# Patient Record
Sex: Male | Born: 1965
Health system: Southern US, Community
[De-identification: ages and names within clinical notes are randomized; demographics above are authoritative.]

## PROBLEM LIST (undated history)

## (undated) DIAGNOSIS — E785 Hyperlipidemia, unspecified: Secondary | ICD-10-CM

## (undated) DIAGNOSIS — I10 Essential (primary) hypertension: Secondary | ICD-10-CM

## (undated) HISTORY — DX: Hyperlipidemia, unspecified: E78.5

## (undated) NOTE — *Deleted (*Deleted)
Advanced Heart Failure Clinic Note   PCP: Patient, No Pcp Per PCP-Cardiologist: Nanetta Batty, MD  HF: Dr Gala Romney  HPI: Martin Keller is a 26 y.o. malewith a hx of HTN, tobacco use, systolic HF (diagnosed 03/2018), and severe MR (diagnosed 12/19). He does not have insurance.  He was admitted 12/1-12/5/19 with acute systolic HF. Echo showed EF 30-35% and severe MR. R/LHC showed normal coronaries, severe NICM with EF 20-25%, and severe 3-4+ MR. TEE completed to further evaluate MR and showed severe functional MR.   Today he returns for HF follow up. Last visit he was instructed to take entresto and coreg twice a day because he had previously been taking entresto and coreg once a day. Overall feeling fine. Denies SOB/PND/Orthopnea. Appetite ok. No fever or chills. Weight at home  pounds. Taking all medications. Echo 11/2018: EF ~30%  Echo 03/04/18:EF 30-35%, diffuse HK, severe MR, LA severely dilated, RA mod dilated, RV mildly down  [facility] 03/05/18 Ao = 108/68 (84)  LV = 110/15 RA = 2 RV = 39/4 PA = 31/10 (20) PCW = 8 (no significant v-waves) Fick cardiac output/index = 5.0/2.7 SVR = 1307 PVR = 2.4 WU Ao sat = 97% PA sat = 68%, 70% Assessment: 1. Normal coronaries 2. Severe NICM EF 20-25% 3. 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 Well compensated filling pressures  TEE 03/07/18:  EF 25%, diffuse HK, RV mildly decreased function, trivial TR, severe central mitral regurgitation, ERO 0.6 cm^2 by PISA.  Impression: severe functional mitral regurgitation  SH: Smokes 2 black and milds/day. no insurance, no problems with transportation, unable to afford medications. Rare ETOH use. No longer working.  FH: mother with murmur, died of MI at 43 years, father died of MI in 46s.  Review of systems complete and found to be negative unless listed in HPI.   Past Medical History:  Diagnosis Date  . Hyperlipidemia   . Hypertension     Current Outpatient Medications  Medication Sig Dispense Refill  .  aspirin EC 81 MG tablet Take 1 tablet (81 mg total) by mouth daily. Swallow whole. 90 tablet 3  . carvedilol (COREG) 3.125 MG tablet Take 1 tablet (3.125 mg total) by mouth 2 (two) times daily with a meal. 180 tablet 2  . furosemide (LASIX) 40 MG tablet Take 1 tablet (40 mg total) by mouth daily. 90 tablet 2  . Multiple Vitamin (MULTIVITAMIN WITH MINERALS) TABS tablet Take 1 tablet by mouth daily.     . rosuvastatin (CRESTOR) 10 MG tablet Take 1 tablet (10 mg total) by mouth at bedtime. 90 tablet 2  . sacubitril-valsartan (ENTRESTO) 49-51 MG Take 1 tablet by mouth 2 (two) times daily. 180 tablet 2  . spironolactone (ALDACTONE) 25 MG tablet Take 1 tablet (25 mg total) by mouth daily. 90 tablet 2   No current facility-administered medications for this visit.    No Known Allergies    Social History   Socioeconomic History  . Marital status: Divorced    Spouse name: Not on file  . Number of children: Not on file  . Years of education: Not on file  . Highest education level: Not on file  Occupational History  . Not on file  Tobacco Use  . Smoking status: Current Every Day Smoker    Types: Cigars  . Smokeless tobacco: Never Used  Vaping Use  . Vaping Use: Never used  Substance and Sexual Activity  . Alcohol use: Yes  . Drug use: Yes    Types: Marijuana  .  Sexual activity: Yes    Partners: Female    Birth control/protection: None  Other Topics Concern  . Not on file  Social History Narrative  . Not on file   Social Determinants of Health   Financial Resource Strain:   . Difficulty of Paying Living Expenses: Not on file  Food Insecurity:   . Worried About Programme researcher, broadcasting/film/video in the Last Year: Not on file  . Ran Out of Food in the Last Year: Not on file  Transportation Needs:   . Lack of Transportation (Medical): Not on file  . Lack of Transportation (Non-Medical): Not on file  Physical Activity:   . Days of Exercise per Week: Not on file  . Minutes of Exercise per  Session: Not on file  Stress:   . Feeling of Stress : Not on file  Social Connections:   . Frequency of Communication with Friends and Family: Not on file  . Frequency of Social Gatherings with Friends and Family: Not on file  . Attends Religious Services: Not on file  . Active Member of Clubs or Organizations: Not on file  . Attends Banker Meetings: Not on file  . Marital Status: Not on file  Intimate Partner Violence:   . Fear of Current or Ex-Partner: Not on file  . Emotionally Abused: Not on file  . Physically Abused: Not on file  . Sexually Abused: Not on file      Family History  Problem Relation Age of Onset  . Heart attack Mother        Died at age of 52 from heart attack.  Marland Kitchen Heart attack Father        Died at age of 18 from heart attack.  Marland Kitchen Heart disease Brother        Heart surgery at age of 38.  . Diabetes Child     There were no vitals filed for this visit. Wt Readings from Last 3 Encounters:  01/22/20 72.8 kg (160 lb 6.4 oz)  12/02/19 71.9 kg (158 lb 9.6 oz)  11/28/19 72.1 kg (159 lb)    PHYSICAL EXAM: General:  Well appearing. No resp difficulty HEENT: normal Neck: supple. no JVD. Carotids 2+ bilat; no bruits. No lymphadenopathy or thryomegaly appreciated. Cor: PMI nondisplaced. Regular rate & rhythm. No rubs, gallops or murmurs. Lungs: clear Abdomen: soft, nontender, nondistended. No hepatosplenomegaly. No bruits or masses. Good bowel sounds. Extremities: no cyanosis, clubbing, rash, edema Neuro: alert & orientedx3, cranial nerves grossly intact. moves all 4 extremities w/o difficulty. Affect pleasant  ASSESSMENT & PLAN: 1. Chronic systolic HF due to NICM (new as of 03/2018). LHC normal coronaries 12/3. Etiology unclear - possibly HTN or ETOH.  - Echo 12/19: EF 30-35%, diffuse HK, severe MR, LA severely dilated, RA mod dilated, RV mildly down.  - TEE 12/19: EF 25%, severe functional MR - Echo 8/20 EF 25-30% moderate MR -NYHA  Volume  status stable.  -Continue coreg 3.125 mg twice a day.  - Continue entresto 49-51 mg twice a day - Continue spiro to 25 mg daily  - Plan to repeat ECHO in 3 months.    2. Severe MR - TEE 03/07/18: EF 25%, diffuse HK, RV mildly decreased function, trivial TR, severe central mitral regurgitation, ERO 0.6 cm^2 by PISA.  Impression: severe functional mitral regurgitation.  - Moderate by echo in 8/20.  - Functional MR  3. HTN Stable.   4. Tobacco use - Smoking 2 black and milds/day. - Discussed  smoking cessation again today   5. ETOH use -Rarely drinking.    Follow up    Tonye Becket, NP 02/04/20

---

## 2017-08-26 ENCOUNTER — Encounter (HOSPITAL_BASED_OUTPATIENT_CLINIC_OR_DEPARTMENT_OTHER): Payer: Self-pay | Admitting: Emergency Medicine

## 2017-08-26 ENCOUNTER — Other Ambulatory Visit: Payer: Self-pay

## 2017-08-26 DIAGNOSIS — J039 Acute tonsillitis, unspecified: Secondary | ICD-10-CM | POA: Insufficient documentation

## 2017-08-26 DIAGNOSIS — F17200 Nicotine dependence, unspecified, uncomplicated: Secondary | ICD-10-CM | POA: Insufficient documentation

## 2017-08-26 LAB — RAPID STREP SCREEN (MED CTR MEBANE ONLY): STREPTOCOCCUS, GROUP A SCREEN (DIRECT): NEGATIVE

## 2017-08-26 NOTE — ED Triage Notes (Signed)
PT presents with c/o sore throat for 4 days

## 2017-08-27 ENCOUNTER — Emergency Department (HOSPITAL_BASED_OUTPATIENT_CLINIC_OR_DEPARTMENT_OTHER): Payer: Self-pay

## 2017-08-27 ENCOUNTER — Emergency Department (HOSPITAL_BASED_OUTPATIENT_CLINIC_OR_DEPARTMENT_OTHER)
Admission: EM | Admit: 2017-08-27 | Discharge: 2017-08-27 | Disposition: A | Discharge status: Home / Self Care | Payer: Self-pay | Attending: Emergency Medicine | Admitting: Emergency Medicine

## 2017-08-27 DIAGNOSIS — J039 Acute tonsillitis, unspecified: Secondary | ICD-10-CM

## 2017-08-27 LAB — BASIC METABOLIC PANEL
ANION GAP: 7 (ref 5–15)
BUN: 13 mg/dL (ref 6–20)
CO2: 21 mmol/L — ABNORMAL LOW (ref 22–32)
Calcium: 8.3 mg/dL — ABNORMAL LOW (ref 8.9–10.3)
Chloride: 110 mmol/L (ref 101–111)
Creatinine, Ser: 0.92 mg/dL (ref 0.61–1.24)
Glucose, Bld: 103 mg/dL — ABNORMAL HIGH (ref 65–99)
Potassium: 4.1 mmol/L (ref 3.5–5.1)
SODIUM: 138 mmol/L (ref 135–145)

## 2017-08-27 MED ORDER — HYDROCODONE-ACETAMINOPHEN 5-325 MG PO TABS
1.0000 | ORAL_TABLET | Freq: Once | ORAL | Status: AC
  Administered 2017-08-27: 1 via ORAL
  Filled 2017-08-27: qty 1

## 2017-08-27 MED ORDER — CLINDAMYCIN HCL 150 MG PO CAPS: 300.0000 mg | ORAL_CAPSULE | Freq: Four times a day (QID) | ORAL | 0 refills | Status: DC

## 2017-08-27 MED ORDER — HYDROCODONE-ACETAMINOPHEN 5-325 MG PO TABS: 1.0000 | ORAL_TABLET | Freq: Four times a day (QID) | ORAL | 0 refills | Status: DC | PRN

## 2017-08-27 MED ORDER — DEXAMETHASONE SODIUM PHOSPHATE 10 MG/ML IJ SOLN
10.0000 mg | Freq: Once | INTRAMUSCULAR | Status: AC
  Administered 2017-08-27: 10 mg via INTRAMUSCULAR
  Filled 2017-08-27: qty 1

## 2017-08-27 MED ORDER — IOPAMIDOL (ISOVUE-300) INJECTION 61%
100.0000 mL | Freq: Once | INTRAVENOUS | Status: AC | PRN
  Administered 2017-08-27: 100 mL via INTRAVENOUS

## 2017-08-27 MED ORDER — CLINDAMYCIN HCL 150 MG PO CAPS
300.0000 mg | ORAL_CAPSULE | Freq: Once | ORAL | Status: AC
  Administered 2017-08-27: 300 mg via ORAL
  Filled 2017-08-27: qty 2

## 2017-08-27 NOTE — ED Provider Notes (Signed)
[facility] EMERGENCY DEPARTMENT Provider Note   CSN: [id number] Arrival date & time: 08/26/17  2225     History   Chief Complaint Chief Complaint  Patient presents with  . Sore Throat    HPI Martin Keller is a 52 y.o. male.  HPI   This is a 52 year old male who presents with 4-day history of sore throat.  Patient reports difficulty swallowing and pain over the anterior neck right greater than left.  Patient reports difficulty swallowing.  He has not taken anything for his pain.  Currently he rates his pain at 8 out of 10.  He denies any cough, ear pain, congestion.  Reports chills but no noted fevers at home.  Temperature 100.5 upon arrival.  History reviewed. No pertinent past medical history.  There are no active problems to display for this patient.   History reviewed. No pertinent surgical history.      Home Medications    Prior to Admission medications   Medication Sig Start Date End Date Taking? Authorizing Provider  clindamycin (CLEOCIN) 150 MG capsule Take 2 capsules (300 mg total) by mouth 4 (four) times daily. 08/27/17   Adaleah Forget, Mayer Masker, MD  HYDROcodone-acetaminophen (NORCO/VICODIN) 5-325 MG tablet Take 1 tablet by mouth every 6 (six) hours as needed. 08/27/17   Karyn Brull, Mayer Masker, MD    Family History No family history on file.  Social History Social History   Tobacco Use  . Smoking status: Current Every Day Smoker  . Smokeless tobacco: Never Used  Substance Use Topics  . Alcohol use: Never    Frequency: Never  . Drug use: Never     Allergies   Patient has no known allergies.   Review of Systems Review of Systems  Constitutional: Positive for fever.  HENT: Positive for sore throat and trouble swallowing. Negative for congestion.   Respiratory: Negative for shortness of breath.   Cardiovascular: Negative for chest pain.  Gastrointestinal: Negative for abdominal pain, nausea and vomiting.  Musculoskeletal: Negative for neck  pain and neck stiffness.  All other systems reviewed and are negative.    Physical Exam Updated Vital Signs BP (!) 150/91 (BP Location: Right Arm)   Pulse 77   Temp (!) 100.5 F (38.1 C) (Oral)   Resp 20   Ht 5\' 10"  (1.778 m)   Wt 78 kg (172 lb)   SpO2 95%   BMI 24.68 kg/m   Physical Exam  Constitutional: He is oriented to person, place, and time. He appears well-developed and well-nourished.  Non-toxic appearance. He does not appear ill.  HENT:  Head: Normocephalic and atraumatic.  Slightly muffled voice, swelling of the bilateral tonsils right greater than left without obvious abscess, uvula deviates to the right, no exudate  Eyes: Pupils are equal, round, and reactive to light.  Neck: Normal range of motion. Neck supple.  Cardiovascular: Normal rate, regular rhythm and normal heart sounds.  No murmur heard. Pulmonary/Chest: Effort normal and breath sounds normal. No respiratory distress. He has no wheezes.  Abdominal: Soft. There is no tenderness.  Musculoskeletal: He exhibits no edema.  Lymphadenopathy:    He has cervical adenopathy.  Neurological: He is alert and oriented to person, place, and time.  Skin: Skin is warm and dry.  Psychiatric: He has a normal mood and affect.  Nursing note and vitals reviewed.    ED Treatments / Results  Labs (all labs ordered are listed, but only abnormal results are displayed) Labs Reviewed  RAPID STREP SCREEN ([facility] &  [facility] ONLY)  CULTURE, GROUP A STREP [facility])  BASIC METABOLIC PANEL    EKG None  Radiology Ct Soft Tissue Neck W Contrast  Result Date: 08/27/2017 CLINICAL DATA:  Sore throat and stridor EXAM: CT NECK WITH CONTRAST TECHNIQUE: Multidetector CT imaging of the neck was performed using the standard protocol following the bolus administration of intravenous contrast. CONTRAST:  ISOVUE-300 IOPAMIDOL (ISOVUE-300) INJECTION 61% COMPARISON:  None. FINDINGS: PHARYNX AND LARYNX: --Nasopharynx: Mildly enlarged adenoid  tonsils. --Oral cavity and oropharynx: Right-greater-than-left enlargement of the palatine tonsils. There are multiple locules of fluid attenuation material within the right palatine tonsil, largest which measures 9 x 7 mm. Lingual tonsils are also enlarged. --Hypopharynx: Normal vallecula and pyriform sinuses. --Larynx: Normal epiglottis and pre-epiglottic space. Normal aryepiglottic and vocal folds. --Retropharyngeal space: No abscess, effusion or lymphadenopathy. SALIVARY GLANDS: --Parotid: No mass lesion or inflammation. No sialolithiasis or ductal dilatation. --Submandibular: Symmetric without inflammation. No sialolithiasis or ductal dilatation. --Sublingual: Normal. No ranula or other visible lesion of the base of tongue and floor of mouth. THYROID: Normal. LYMPH NODES: Enlarged bilateral level 2A lymph nodes measure up to 12 mm. No abnormal density adenopathy. VASCULAR: Major cervical vessels are patent. LIMITED INTRACRANIAL: Normal. VISUALIZED ORBITS: Normal. MASTOIDS AND VISUALIZED PARANASAL SINUSES: No fluid levels or advanced mucosal thickening. No mastoid effusion. SKELETON: No bony spinal canal stenosis. No lytic or blastic lesions. UPPER CHEST: Biapical scarring. OTHER: None. IMPRESSION: 1. Enlargement of the adenoid, palatine and lingual tonsils, consistent with acute tonsillopharyngitis. The epiglottis is normal. 2. Multiple locules of fluid at the right palatine tonsil, likely indicating early stages of peritonsillar abscess formation. 3. Bilateral reactive cervical lymphadenopathy. Electronically Signed   By: Deatra Robinson M.D.   On: 08/27/2017 01:32    Procedures Procedures (including critical care time)  Medications Ordered in ED Medications  clindamycin (CLEOCIN) capsule 300 mg (has no administration in time range)  dexamethasone (DECADRON) injection 10 mg (10 mg Intramuscular Given 08/27/17 0143)  HYDROcodone-acetaminophen (NORCO/VICODIN) 5-325 MG per tablet 1 tablet (1 tablet Oral  Given 08/27/17 0133)  iopamidol (ISOVUE-300) 61 % injection 100 mL (100 mLs Intravenous Contrast Given 08/27/17 0056)     Initial Impression / Assessment and Plan / ED Course  I have reviewed the triage vital signs and the nursing notes.  Pertinent labs & imaging results that were available during my care of the patient were reviewed by me and considered in my medical decision making (see chart for details).     Patient presents with sore throat.  Noted to be febrile upon arrival.  Is overall nontoxic-appearing and vital signs are otherwise notable for blood pressure 150/91.  He has market swelling of the posterior oropharynx and bilateral tonsils right greater than left.  No obvious peritonsillar abscess that is drainable.  Uvula deviates to the right slightly.  Given slightly muffled voice and oropharyngeal findings, CT scan obtained to rule out deep space infection.  Patient was given Decadron and Norco for pain.  His ABCs are intact.  CT scan shows diffused tonsillitis as well as likely early peritonsillar abscess on the right.  Nothing drainable on exam.  Will treat with clindamycin and provide ENT follow-up.  This was discussed with the patient.  He is able to tolerate fluids without difficulty.  After history, exam, and medical workup I feel the patient has been appropriately medically screened and is safe for discharge home. Pertinent diagnoses were discussed with the patient. Patient was given return precautions.   Final Clinical Impressions(s) /  ED Diagnoses   Final diagnoses:  Tonsillitis    ED Discharge Orders        Ordered    clindamycin (CLEOCIN) 150 MG capsule  4 times daily     08/27/17 0202    HYDROcodone-acetaminophen (NORCO/VICODIN) 5-325 MG tablet  Every 6 hours PRN     08/27/17 0202       Shon Baton, MD 08/27/17 0207

## 2017-08-27 NOTE — Discharge Instructions (Addendum)
Your work-up today is concerning for tonsillitis.  You also may have an early abscess on the right.  Take medications as prescribed.  If you develop difficulty swallowing, worsening pain, you need to be reevaluated immediately.  Follow-up with ENT.

## 2017-08-27 NOTE — ED Notes (Signed)
ED Provider at bedside. 

## 2017-08-27 NOTE — ED Notes (Signed)
Pt given d/c instructions as per chart. Rx x 2 with precautions. Verbalizes understanding. No questions. 

## 2017-08-29 LAB — CULTURE, GROUP A STREP (THRC)

## 2017-08-30 ENCOUNTER — Telehealth: Payer: Self-pay | Admitting: *Deleted

## 2017-08-30 NOTE — Telephone Encounter (Signed)
Post ED Visit - Positive Culture Follow-up: Unsuccessful Patient Follow-up  Culture assessed and recommendations reviewed by:  []  Enzo Bi, Pharm.D. []  Celedonio Miyamoto, Pharm.D., BCPS AQ-ID []  Garvin Fila, Pharm.D., BCPS []  Georgina Pillion, [address].D., BCPS []  Robinson, [place].D., BCPS, AAHIVP []  Estella Husk, Pharm.D., BCPS, AAHIVP []  Sherlynn Carbon, PharmD []  Pollyann Samples, PharmD, BCPS  Positive strep culture  []  Patient discharged with antimicrobial prescription and dosing needs to be changed to Clindamycin 150mg  capsules, take 2 (300mg ) PO TID for a total of 7 days of therapy []  Organism is resistant to prescribed ED discharge antimicrobial []  Patient with positive blood cultures   Unable to contact patient after 3 attempts, letter will be sent to address on file  Lysle Pearl 08/30/2017, 10:20 AM

## 2018-02-04 ENCOUNTER — Telehealth: Payer: Self-pay | Admitting: Emergency Medicine

## 2018-02-04 NOTE — Telephone Encounter (Signed)
Lost to followup 

## 2018-03-03 ENCOUNTER — Emergency Department (HOSPITAL_BASED_OUTPATIENT_CLINIC_OR_DEPARTMENT_OTHER): Payer: Self-pay

## 2018-03-03 ENCOUNTER — Other Ambulatory Visit: Payer: Self-pay

## 2018-03-03 ENCOUNTER — Encounter (HOSPITAL_BASED_OUTPATIENT_CLINIC_OR_DEPARTMENT_OTHER): Payer: Self-pay | Admitting: Emergency Medicine

## 2018-03-03 ENCOUNTER — Inpatient Hospital Stay (HOSPITAL_BASED_OUTPATIENT_CLINIC_OR_DEPARTMENT_OTHER)
Admission: EM | Admit: 2018-03-03 | Discharge: 2018-03-07 | DRG: 287 | Disposition: A | Discharge status: Home / Self Care | Payer: Self-pay | Attending: Internal Medicine | Admitting: Internal Medicine

## 2018-03-03 DIAGNOSIS — Z8249 Family history of ischemic heart disease and other diseases of the circulatory system: Secondary | ICD-10-CM

## 2018-03-03 DIAGNOSIS — R7303 Prediabetes: Secondary | ICD-10-CM | POA: Diagnosis present

## 2018-03-03 DIAGNOSIS — Z79899 Other long term (current) drug therapy: Secondary | ICD-10-CM

## 2018-03-03 DIAGNOSIS — I5023 Acute on chronic systolic (congestive) heart failure: Secondary | ICD-10-CM | POA: Diagnosis present

## 2018-03-03 DIAGNOSIS — Z833 Family history of diabetes mellitus: Secondary | ICD-10-CM

## 2018-03-03 DIAGNOSIS — I428 Other cardiomyopathies: Secondary | ICD-10-CM | POA: Diagnosis present

## 2018-03-03 DIAGNOSIS — I5021 Acute systolic (congestive) heart failure: Secondary | ICD-10-CM

## 2018-03-03 DIAGNOSIS — F17290 Nicotine dependence, other tobacco product, uncomplicated: Secondary | ICD-10-CM | POA: Diagnosis present

## 2018-03-03 DIAGNOSIS — I340 Nonrheumatic mitral (valve) insufficiency: Secondary | ICD-10-CM | POA: Diagnosis present

## 2018-03-03 DIAGNOSIS — R778 Other specified abnormalities of plasma proteins: Secondary | ICD-10-CM

## 2018-03-03 DIAGNOSIS — I10 Essential (primary) hypertension: Secondary | ICD-10-CM

## 2018-03-03 DIAGNOSIS — R9431 Abnormal electrocardiogram [ECG] [EKG]: Secondary | ICD-10-CM

## 2018-03-03 DIAGNOSIS — I509 Heart failure, unspecified: Secondary | ICD-10-CM

## 2018-03-03 DIAGNOSIS — R7989 Other specified abnormal findings of blood chemistry: Secondary | ICD-10-CM

## 2018-03-03 DIAGNOSIS — I110 Hypertensive heart disease with heart failure: Principal | ICD-10-CM | POA: Diagnosis present

## 2018-03-03 HISTORY — DX: Essential (primary) hypertension: I10

## 2018-03-03 LAB — LIPID PANEL
Cholesterol: 169 mg/dL (ref 0–200)
HDL: 33 mg/dL — ABNORMAL LOW (ref >40)
LDL CALC: 118 mg/dL — AB (ref 0–99)
Total CHOL/HDL Ratio: 5.1 RATIO
Triglycerides: 91 mg/dL (ref <150)
VLDL: 18 mg/dL (ref 0–40)

## 2018-03-03 LAB — TROPONIN I
Troponin I: 0.03 ng/mL (ref <0.03)
Troponin I: <0.03 ng/mL (ref <0.03)
Troponin I: <0.03 ng/mL (ref <0.03)

## 2018-03-03 LAB — CBC
HCT: 40.4 % (ref 39.0–52.0)
HEMATOCRIT: 41.5 % (ref 39.0–52.0)
Hemoglobin: 13 g/dL (ref 13.0–17.0)
Hemoglobin: 13 g/dL (ref 13.0–17.0)
MCH: 27.5 pg (ref 26.0–34.0)
MCH: 27.4 pg (ref 26.0–34.0)
MCHC: 31.3 g/dL (ref 30.0–36.0)
MCHC: 32.2 g/dL (ref 30.0–36.0)
MCV: 87.7 fL (ref 80.0–100.0)
MCV: 85.2 fL (ref 80.0–100.0)
NRBC: 0 % (ref 0.0–0.2)
PLATELETS: 233 10*3/uL (ref 150–400)
Platelets: 228 10*3/uL (ref 150–400)
RBC: 4.73 MIL/uL (ref 4.22–5.81)
RBC: 4.74 MIL/uL (ref 4.22–5.81)
RDW: 15.6 % — ABNORMAL HIGH (ref 11.5–15.5)
RDW: 15.1 % (ref 11.5–15.5)
WBC: 6.6 10*3/uL (ref 4.0–10.5)
WBC: 7 10*3/uL (ref 4.0–10.5)
nRBC: 0 % (ref 0.0–0.2)

## 2018-03-03 LAB — PROTIME-INR
INR: 1.07
Prothrombin Time: 13.8 seconds (ref 11.4–15.2)

## 2018-03-03 LAB — BRAIN NATRIURETIC PEPTIDE: B Natriuretic Peptide: 702.3 pg/mL — ABNORMAL HIGH (ref 0.0–100.0)

## 2018-03-03 LAB — TSH: TSH: 2.811 u[IU]/mL (ref 0.350–4.500)

## 2018-03-03 LAB — BASIC METABOLIC PANEL
Anion gap: 6 (ref 5–15)
BUN: 18 mg/dL (ref 6–20)
CO2: 20 mmol/L — ABNORMAL LOW (ref 22–32)
CREATININE: 1.13 mg/dL (ref 0.61–1.24)
Calcium: 9 mg/dL (ref 8.9–10.3)
Chloride: 113 mmol/L — ABNORMAL HIGH (ref 98–111)
GFR calc Af Amer: >60 mL/min (ref >60)
Glucose, Bld: 126 mg/dL — ABNORMAL HIGH (ref 70–99)
Potassium: 3.6 mmol/L (ref 3.5–5.1)
SODIUM: 139 mmol/L (ref 135–145)

## 2018-03-03 LAB — MAGNESIUM: MAGNESIUM: 2.1 mg/dL (ref 1.7–2.4)

## 2018-03-03 LAB — CREATININE, SERUM
Creatinine, Ser: 1.13 mg/dL (ref 0.61–1.24)
GFR calc Af Amer: >60 mL/min (ref >60)
GFR calc non Af Amer: >60 mL/min (ref >60)

## 2018-03-03 LAB — HEMOGLOBIN A1C
HEMOGLOBIN A1C: 5.8 % — AB (ref 4.8–5.6)
Mean Plasma Glucose: 119.76 mg/dL

## 2018-03-03 MED ORDER — NICOTINE 14 MG/24HR TD PT24
14.0000 mg | MEDICATED_PATCH | Freq: Every day | TRANSDERMAL | Status: DC
  Filled 2018-03-03 (×4): qty 1

## 2018-03-03 MED ORDER — ACETAMINOPHEN 325 MG PO TABS
650.0000 mg | ORAL_TABLET | ORAL | Status: DC | PRN
  Administered 2018-03-04: 650 mg via ORAL
  Filled 2018-03-03: qty 2

## 2018-03-03 MED ORDER — CARVEDILOL 3.125 MG PO TABS
3.1250 mg | ORAL_TABLET | Freq: Two times a day (BID) | ORAL | Status: DC
  Administered 2018-03-04 – 2018-03-07 (×7): 3.125 mg via ORAL
  Filled 2018-03-03 (×8): qty 1

## 2018-03-03 MED ORDER — HYDRALAZINE HCL 25 MG PO TABS
25.0000 mg | ORAL_TABLET | Freq: Once | ORAL | Status: AC
  Administered 2018-03-03: 25 mg via ORAL
  Filled 2018-03-03: qty 1

## 2018-03-03 MED ORDER — LISINOPRIL 5 MG PO TABS
5.0000 mg | ORAL_TABLET | Freq: Every day | ORAL | Status: DC
  Administered 2018-03-03: 5 mg via ORAL
  Filled 2018-03-03: qty 1

## 2018-03-03 MED ORDER — IPRATROPIUM-ALBUTEROL 0.5-2.5 (3) MG/3ML IN SOLN
3.0000 mL | Freq: Once | RESPIRATORY_TRACT | Status: AC
  Administered 2018-03-03: 3 mL via RESPIRATORY_TRACT
  Filled 2018-03-03: qty 3

## 2018-03-03 MED ORDER — SODIUM CHLORIDE 0.9 % IV SOLN: 250.0000 mL | INTRAVENOUS | Status: DC | PRN

## 2018-03-03 MED ORDER — ALBUTEROL SULFATE (2.5 MG/3ML) 0.083% IN NEBU
2.5000 mg | INHALATION_SOLUTION | Freq: Once | RESPIRATORY_TRACT | Status: AC
  Administered 2018-03-03: 2.5 mg via RESPIRATORY_TRACT
  Filled 2018-03-03: qty 3

## 2018-03-03 MED ORDER — ENOXAPARIN SODIUM 40 MG/0.4ML ~~LOC~~ SOLN
40.0000 mg | SUBCUTANEOUS | Status: DC
  Administered 2018-03-03 – 2018-03-04 (×2): 40 mg via SUBCUTANEOUS
  Filled 2018-03-03 (×3): qty 0.4

## 2018-03-03 MED ORDER — SODIUM CHLORIDE 0.9% FLUSH: 3.0000 mL | INTRAVENOUS | Status: DC | PRN

## 2018-03-03 MED ORDER — SODIUM CHLORIDE 0.9% FLUSH
3.0000 mL | Freq: Two times a day (BID) | INTRAVENOUS | Status: DC
  Administered 2018-03-03 – 2018-03-06 (×5): 3 mL via INTRAVENOUS

## 2018-03-03 MED ORDER — FUROSEMIDE 10 MG/ML IJ SOLN
20.0000 mg | Freq: Once | INTRAMUSCULAR | Status: AC
  Administered 2018-03-03: 20 mg via INTRAVENOUS
  Filled 2018-03-03: qty 2

## 2018-03-03 MED ORDER — FUROSEMIDE 10 MG/ML IJ SOLN
20.0000 mg | Freq: Two times a day (BID) | INTRAMUSCULAR | Status: DC
  Administered 2018-03-03 (×2): 20 mg via INTRAVENOUS
  Filled 2018-03-03 (×2): qty 2

## 2018-03-03 MED ORDER — ALBUTEROL SULFATE (2.5 MG/3ML) 0.083% IN NEBU: 5.0000 mg | INHALATION_SOLUTION | Freq: Once | RESPIRATORY_TRACT | Status: DC

## 2018-03-03 NOTE — ED Provider Notes (Signed)
[facility] EMERGENCY DEPARTMENT Provider Note   CSN: [id number] Arrival date & time: 03/03/18  8469     History   Chief Complaint Chief Complaint  Patient presents with  . Shortness of Breath    HPI Martin Keller is a 52 y.o. male.  HPI 52 year old male comes in with chief complaint of shortness of breath. Patient reports that he has history of hypertension.  He is not taking medications for it.  Over the past 2 weeks he has had shortness of breath with exertion and nonspecific chest pain.  Is also noted that his shortness of breath gets worse when he is laying flat at nighttime. Patient works as a Naval architect, he denies any history of PE, DVT.  He was noted to be wheezing in the ED and denies any history of lung disease.  Patient smokes a couple of cigars every day.  He has no history of coronary artery disease and denies any substance abuse.  Past Medical History:  Diagnosis Date  . Hypertension     There are no active problems to display for this patient.   History reviewed. No pertinent surgical history.      Home Medications    Prior to Admission medications   Medication Sig Start Date End Date Taking? Authorizing Provider  clindamycin (CLEOCIN) 150 MG capsule Take 2 capsules (300 mg total) by mouth 4 (four) times daily. 08/27/17   Horton, Mayer Masker, MD  HYDROcodone-acetaminophen (NORCO/VICODIN) 5-325 MG tablet Take 1 tablet by mouth every 6 (six) hours as needed. 08/27/17   Horton, Mayer Masker, MD    Family History No family history on file.  Social History Social History   Tobacco Use  . Smoking status: Current Every Day Smoker  . Smokeless tobacco: Never Used  Substance Use Topics  . Alcohol use: Never    Frequency: Never  . Drug use: Never     Allergies   Patient has no known allergies.   Review of Systems Review of Systems  Constitutional: Positive for activity change.  Respiratory: Positive for shortness of breath.     Cardiovascular: Positive for chest pain.  Neurological: Negative for dizziness.  All other systems reviewed and are negative.    Physical Exam Updated Vital Signs BP (!) 162/109   Pulse 69   Temp 97.7 F (36.5 C) (Oral)   Resp 18   Ht 5\' 10"  (1.778 m)   Wt 79.4 kg   SpO2 96%   BMI 25.11 kg/m   Physical Exam  Constitutional: He is oriented to person, place, and time. He appears well-developed.  HENT:  Head: Atraumatic.  Eyes: Pupils are equal, round, and reactive to light. Conjunctivae and EOM are normal.  Neck: Normal range of motion. Neck supple.  Cardiovascular: Normal rate, regular rhythm and normal heart sounds.  Pulmonary/Chest: Effort normal. No respiratory distress. He has wheezes. He has no rhonchi. He has rales in the right middle field, the right lower field, the left middle field and the left lower field.  Abdominal: Soft. Bowel sounds are normal. He exhibits no distension. There is no tenderness. There is no rebound and no guarding.  Musculoskeletal:       Right lower leg: He exhibits no tenderness and no edema.       Left lower leg: He exhibits no tenderness and no edema.  Neurological: He is alert and oriented to person, place, and time.  Skin: Skin is warm.     ED Treatments / Results  Labs (all labs ordered are listed, but only abnormal results are displayed) Labs Reviewed  BASIC METABOLIC PANEL - Abnormal; Notable for the following components:      Result Value   Chloride 113 (*)    CO2 20 (*)    Glucose, Bld 126 (*)    All other components within normal limits  CBC - Abnormal; Notable for the following components:   RDW 15.6 (*)    All other components within normal limits  MAGNESIUM  PROTIME-INR  TROPONIN I  BRAIN NATRIURETIC PEPTIDE    EKG EKG Interpretation  Date/Time:  Sunday March 03 2018 05:39:05 EST Ventricular Rate:  74 PR Interval:    QRS Duration: 100 QT Interval:  437 QTC Calculation: 485 R Axis:   27 Text  Interpretation:  Sinus rhythm Probable left atrial enlargement Left ventricular hypertrophy Borderline T abnormalities, inferior leads Borderline prolonged QT interval No acute changes No old tracing to compare Confirmed by Derwood Kaplan [phone]) on 03/03/2018 5:43:02 AM   Radiology Dg Chest 2 View  Result Date: 03/03/2018 CLINICAL DATA:  Initial evaluation for acute shortness of breath, history of CHF. EXAM: CHEST - 2 VIEW COMPARISON:  None. FINDINGS: Cardiomegaly. Mediastinal silhouette within normal limits. Lungs normally inflated. Diffuse vascular congestion with interstitial prominence in scattered Kerley B-lines, compatible with pulmonary interstitial edema. No focal infiltrates. No significant pleural effusion. No pneumothorax. No acute osseus abnormality. IMPRESSION: Cardiomegaly with mild diffuse pulmonary interstitial edema, compatible with acute CHF exacerbation. Electronically Signed   By: Rise Mu M.D.   On: 03/03/2018 06:30    Procedures Procedures (including critical care time)  Medications Ordered in ED Medications  ipratropium-albuterol (DUONEB) 0.5-2.5 (3) MG/3ML nebulizer solution 3 mL (3 mLs Nebulization Given 03/03/18 0553)  albuterol (PROVENTIL) (2.5 MG/3ML) 0.083% nebulizer solution 2.5 mg (2.5 mg Nebulization Given 03/03/18 0538)     Initial Impression / Assessment and Plan / ED Course  I have reviewed the triage vital signs and the nursing notes.  Pertinent labs & imaging results that were available during my care of the patient were reviewed by me and considered in my medical decision making (see chart for details).  Clinical Course as of Mar 03 640  Wynelle Link Mar 03, 2018  4492 X-ray shows pulmonary edema.  X-ray confirms the concerns for CHF.  We will admit [facility] to [facility] for further work-up.  DG Chest 2 View [AN]  [phone] Results from the ER workup discussed with the patient face to face and all questions answered to the best of my ability.    [AN]      Clinical Course User Index [AN] Derwood Kaplan, MD    52 year old male comes in with chief complaint of shortness of breath.  He has had exertional shortness of breath and nonspecific chest discomfort for the past few days.  Additionally he is having orthopnea-like symptoms along with cough and is noted to have wheezing when he arrived.  Clinically, it appears that patient is having CHF exacerbation due to new onset CHF.  He has rales on exam.  Additionally, PE, pneumonia, COPD are also in the differential diagnosis.  Final Clinical Impressions(s) / ED Diagnoses   Final diagnoses:  Acute systolic congestive heart failure [facility])    ED Discharge Orders    None       Derwood Kaplan, MD 03/03/18 [phone]

## 2018-03-03 NOTE — ED Triage Notes (Signed)
Pt reports SOB x 2 weeks when he lays down. Pt denies any hx of CHF and ankle swelling.

## 2018-03-03 NOTE — H&P (Signed)
History and Physical    Martin Keller XHF:[id number] DOB: 18-Nov-1965 DOA: 03/03/2018  PCP: Patient, No Pcp Per Patient coming from: Home  Chief Complaint: Shortness of breath  HPI: Martin Keller is a 52 y.o. male with medical history significant for hypertension not on any medication, heart murmur and tobacco use who presented [place] with shortness of breath.  Patient presented to [facility] with 2 weeks of gradually worsening shortness of breath.  Shortness of breath is gotten worse over the last 6 days.  Symptoms worse with exertion.  Reports orthopnea and PND as well.  Denies chest pain.  Reports intermittent palpitation.  Denies URI symptoms, fever, chills, nausea, vomiting, abdominal pain, dysuria or focal neuro deficit.  Has history of hypertension.  Not on any medication.  Denies prior history of heart failure, heart attack, kidney disease or diabetes. Family history significant for mother died at 55 from heart attack, father died from heart attack 28, older brother with heart surgery at 11 and daughter with diabetes. Lives with girlfriend.  Reports smoking to block a mild to daily for 30 years.  Reports occasional alcohol.  Last drink was yesterday.  He says he had one shot of tequila yesterday.  Denies prior history of withdrawal.  Reports history of marijuana use.  Reports smoking 1-2 a day.  At [facility] ED, vital signs not impressive.  EKG with some T wave changes in lateral leads, lateral leads, LAE and borderline QTC.  Chest x-ray concerning for pulmonary congestion.  BNP elevated to 700 (no prior to compare to).  Troponin 0 0.03.  BNP basically not impressive.  Admission accepted by previous provider.  Review of Systems  Constitutional: Negative for chills, fever and weight loss.  HENT: Negative for congestion and sore throat.   Eyes: Negative for blurred vision and photophobia.  Respiratory: Positive for shortness of breath. Negative for cough and wheezing.     Cardiovascular: Positive for palpitations, orthopnea and PND. Negative for chest pain, claudication and leg swelling.  Gastrointestinal: Negative for abdominal pain, blood in stool, diarrhea, melena, nausea and vomiting.  Genitourinary: Negative for dysuria and hematuria.  Musculoskeletal: Negative for myalgias.  Skin: Negative for rash.  Neurological: Negative for sensory change, speech change, focal weakness and headaches.  Endo/Heme/Allergies: Does not bruise/bleed easily.  Psychiatric/Behavioral: Positive for substance abuse. The patient is not nervous/anxious.     PMH Past Medical History:  Diagnosis Date  . Hypertension    PSH History reviewed. No pertinent surgical history. Fam HX Family History  Problem Relation Age of Onset  . Heart attack Mother        Died at age of 23 from heart attack.  Marland Kitchen Heart attack Father        Died at age of 68 from heart attack.  Marland Kitchen Heart disease Brother        Heart surgery at age of 69.  . Diabetes Child     Social Hx  reports that he has been smoking cigars. He has never used smokeless tobacco. He reports that he drinks alcohol. He reports that he has current or past drug history. Drug: Marijuana.  Allergy No Known Allergies Home Meds Prior to Admission medications   Not on File    Physical Exam: Vitals:   03/03/18 0712 03/03/18 0717 03/03/18 0807 03/03/18 0941  BP:  (!) 146/102 (!) 151/92 (!) 154/90  Pulse: 66  69 75  Resp: (!) 26  (!) 24 20  Temp:    98.3 F (  36.8 C)  TempSrc:    Oral  SpO2: 93%  95% 99%  Weight:    71.3 kg  Height:    5\' 10"  (1.778 m)    General: NAD, calm, comfortable Eyes: lids and conjunctivae normal HENT: atraumatic. Normocephalic. Hearing grossly intact. No rhinorrhea. MMM. Neck/Endo: Supple.  Mild JVD to mid neck. Resp: normal effort. Good air movement bilaterally.  Fine bibasilar crackles. CVS: RRR. S1 & S2 heard. No murmurs. No edema.  GI: normal bowel sound. No tenderness. No  distention. GU: no suprapubic or CVA tenderness.  MSK: No obvious deformity. No focal tenderness. Moving extremities.  Skin: no apparent lesion. Normal warmth.  Neuro: AAOx4. CN 2-12 grossly intact. Normal strength. Light sensation grossly intact. DTR symmetric.  Psych: Calm. Normal judgment and insight.  Labs on Admission: I have personally reviewed following labs and imaging studies  CBC: Recent Labs  Lab 03/03/18 0546 03/03/18 1218  WBC 6.6 7.0  HGB 13.0 13.0  HCT 41.5 40.4  MCV 87.7 85.2  PLT 233 228   Basic Metabolic Panel: Recent Labs  Lab 03/03/18 0546 03/03/18 1218  NA 139  --   K 3.6  --   CL 113*  --   CO2 20*  --   GLUCOSE 126*  --   BUN 18  --   CREATININE 1.13 1.13  CALCIUM 9.0  --   MG 2.1  --    GFR: Estimated Creatinine Clearance: 75.4 mL/min (by C-G formula based on SCr of 1.13 mg/dL). Liver Function Tests: No results for input(s): AST, ALT, ALKPHOS, BILITOT, PROT, ALBUMIN in the last 168 hours. No results for input(s): LIPASE, AMYLASE in the last 168 hours. No results for input(s): AMMONIA in the last 168 hours. Coagulation Profile: Recent Labs  Lab 03/03/18 0546  INR 1.07   Cardiac Enzymes: Recent Labs  Lab 03/03/18 0546 03/03/18 1218  TROPONINI 0.03* <0.03   BNP (last 3 results) No results for input(s): PROBNP in the last 8760 hours. HbA1C: Recent Labs    03/03/18 1218  HGBA1C 5.8*   CBG: No results for input(s): GLUCAP in the last 168 hours. Lipid Profile: Recent Labs    03/03/18 1218  CHOL 169  HDL 33*  LDLCALC 118*  TRIG 91  CHOLHDL 5.1   Thyroid Function Tests: Recent Labs    03/03/18 1218  TSH 2.811   Anemia Panel: No results for input(s): VITAMINB12, FOLATE, FERRITIN, TIBC, IRON, RETICCTPCT in the last 72 hours. Urine analysis: No results found for: COLORURINE, APPEARANCEUR, LABSPEC, PHURINE, GLUCOSEU, HGBUR, BILIRUBINUR, KETONESUR, PROTEINUR, UROBILINOGEN, NITRITE, LEUKOCYTESUR  Sepsis Labs:  No  leukocytosis.  Radiological Exams on Admission: Dg Chest 2 View  Result Date: 03/03/2018 CLINICAL DATA:  Initial evaluation for acute shortness of breath, history of CHF. EXAM: CHEST - 2 VIEW COMPARISON:  None. FINDINGS: Cardiomegaly. Mediastinal silhouette within normal limits. Lungs normally inflated. Diffuse vascular congestion with interstitial prominence in scattered Kerley B-lines, compatible with pulmonary interstitial edema. No focal infiltrates. No significant pleural effusion. No pneumothorax. No acute osseus abnormality. IMPRESSION: Cardiomegaly with mild diffuse pulmonary interstitial edema, compatible with acute CHF exacerbation. Electronically Signed   By: Rise Mu M.D.   On: 03/03/2018 06:30    All images have been reviewed by me personally.   EKG: Independently reviewed.  Nonspecific T wave changes in lateral leads, LVH and borderline QTC.  Assessment/Plan Active Problems:   CHF (congestive heart failure) (HCC)   New onset of congestive heart failure (HCC)  New onset CHF:  Unknown type.  patient with dyspnea, orthopnea and PND.  BNP elevated to 700 (no prior to compare to) -IV Lasix 20 mg twice daily-Lasix nave -Low-dose beta-blocker and ACE inhibitors -Echocardiogram -Check A1c, TSH and lipid panel -Monitor daily weight, intake output and renal function.  Borderline troponin: Likely demand ischemia.  He has no chest pain.  EKG with nonspecific T wave changes in lateral leads. -Continue cycling troponin and EKG -Beta-blocker as above -Risk stratification labs as above  Hypertension: Not on medication at home. -Meds as above.  Substance and tobacco use -Counseled -Nicotine patch.  DVT prophylaxis: Lovenox Code Status: Full code Family Communication: No family member at bedside Disposition Plan: Telemetry Consults called: None Admission status: Inpatient.  Patient with new diagnosis of CHF.  Will require at least 48 hours for appropriate evaluation  and transitioning for discharge with appropriate dose of diuretics and GDMT.   Almon Hercules MD [facility] Pager [phone]  If 7PM-7AM, please contact night-coverage [email or web address] Password [email or web address]  03/03/2018, 2:42 PM

## 2018-03-03 NOTE — Progress Notes (Signed)
Admission request from [facility] Dr. Rhunette Croft.  52yom PMH recent dx of HTN, not on meds; no PMH of CHF or COPD, presented to [facility] with SOB, rales, CXR showed pulm edema, EKG non-acute. Treated with neb and Lasix, request admission for new onset CHF.  Reported RR 18 and otherwise stable, no distress. Troponin .03, BNP high.  Plan obs to cardiac tele.  Brendia Sacks, MD [facility] [phone]

## 2018-03-04 ENCOUNTER — Inpatient Hospital Stay (HOSPITAL_COMMUNITY): Payer: Self-pay

## 2018-03-04 DIAGNOSIS — F1290 Cannabis use, unspecified, uncomplicated: Secondary | ICD-10-CM

## 2018-03-04 DIAGNOSIS — R9431 Abnormal electrocardiogram [ECG] [EKG]: Secondary | ICD-10-CM

## 2018-03-04 DIAGNOSIS — R7303 Prediabetes: Secondary | ICD-10-CM

## 2018-03-04 DIAGNOSIS — I340 Nonrheumatic mitral (valve) insufficiency: Secondary | ICD-10-CM

## 2018-03-04 DIAGNOSIS — F17200 Nicotine dependence, unspecified, uncomplicated: Secondary | ICD-10-CM

## 2018-03-04 DIAGNOSIS — I10 Essential (primary) hypertension: Secondary | ICD-10-CM

## 2018-03-04 DIAGNOSIS — I429 Cardiomyopathy, unspecified: Secondary | ICD-10-CM

## 2018-03-04 DIAGNOSIS — I5021 Acute systolic (congestive) heart failure: Secondary | ICD-10-CM

## 2018-03-04 LAB — BASIC METABOLIC PANEL
Anion gap: 12 (ref 5–15)
BUN: 20 mg/dL (ref 6–20)
CO2: 22 mmol/L (ref 22–32)
Calcium: 9.2 mg/dL (ref 8.9–10.3)
Chloride: 102 mmol/L (ref 98–111)
Creatinine, Ser: 1.33 mg/dL — ABNORMAL HIGH (ref 0.61–1.24)
GFR calc Af Amer: >60 mL/min (ref >60)
GFR calc non Af Amer: >60 mL/min (ref >60)
Glucose, Bld: 100 mg/dL — ABNORMAL HIGH (ref 70–99)
Potassium: 3.7 mmol/L (ref 3.5–5.1)
Sodium: 136 mmol/L (ref 135–145)

## 2018-03-04 LAB — ECHOCARDIOGRAM COMPLETE
HEIGHTINCHES: 70 in
Weight: 2473.6 oz

## 2018-03-04 LAB — TROPONIN I: Troponin I: <0.03 ng/mL (ref <0.03)

## 2018-03-04 LAB — HIV ANTIBODY (ROUTINE TESTING W REFLEX): HIV Screen 4th Generation wRfx: NONREACTIVE

## 2018-03-04 LAB — MAGNESIUM: Magnesium: 1.9 mg/dL (ref 1.7–2.4)

## 2018-03-04 MED ORDER — SODIUM CHLORIDE 0.9% FLUSH
3.0000 mL | Freq: Two times a day (BID) | INTRAVENOUS | Status: DC
  Administered 2018-03-04: 3 mL via INTRAVENOUS

## 2018-03-04 MED ORDER — ASPIRIN 81 MG PO CHEW
81.0000 mg | CHEWABLE_TABLET | ORAL | Status: AC
  Administered 2018-03-05: 81 mg via ORAL
  Filled 2018-03-04: qty 1

## 2018-03-04 MED ORDER — SODIUM CHLORIDE 0.9% FLUSH: 3.0000 mL | INTRAVENOUS | Status: DC | PRN

## 2018-03-04 MED ORDER — FUROSEMIDE 20 MG PO TABS
20.0000 mg | ORAL_TABLET | Freq: Two times a day (BID) | ORAL | Status: DC
  Administered 2018-03-04 – 2018-03-06 (×4): 20 mg via ORAL
  Filled 2018-03-04 (×4): qty 1

## 2018-03-04 MED ORDER — SODIUM CHLORIDE 0.9 % IV SOLN: 250.0000 mL | INTRAVENOUS | Status: DC | PRN

## 2018-03-04 MED ORDER — SODIUM CHLORIDE 0.9 % IV SOLN
INTRAVENOUS | Status: DC
  Administered 2018-03-05: 06:00:00 via INTRAVENOUS

## 2018-03-04 NOTE — Progress Notes (Signed)
PROGRESS NOTE  Martin Keller QIW:[id number] DOB: 15-Nov-1965 DOA: 03/03/2018 PCP: Patient, No Pcp Per   LOS: 1 day   Brief Narrative / Interim history: 52 y.o. male with history of hypertension not on any medication, heart murmur and tobacco use who was admitted with dyspnea and orthopnea due to new CHF.  Chest x-ray with pulmonary congestion.  BNP elevated to 700.  Started on IV Lasix 20 mg twice daily with good urine output and improvement in his symptoms but mild creatinine uptrend.  Transitioned to oral Lasix 20 mg twice daily on 03/04/2018.    Subjective: No major events overnight.  Denies dyspnea, orthopnea, chest pain or palpitation.  Reports good urine output.  Assessment & Plan: Active Problems:   CHF (congestive heart failure) (HCC)   New onset of congestive heart failure (HCC)   Essential hypertension   Elevated troponin   Prolonged QT interval  New onset CHF: Unknown type. BNP elevated to 700 (no prior to compare to).  Chest x-ray with pulmonary vascular congestion.  Good response to diuretics  but some uptrend in serum creatinine. -Change Lasix from IV to p.o. -Discontinue ACE inhibitors -Continue low-dose Coreg -Echocardiogram -Risk labs including A1c, thyroid function and lipid panel not impressive. -Monitor daily weight, intake output and renal function.  Borderline troponin:  Resolved.  Likely demand ischemia.  He has no chest pain.  EKG with nonspecific T wave changes and possible LAE. -Beta-blocker as above  Hypertension:  Normotensive. -Meds as above.  Prediabetes: BMP glucose within appropriate range -Encouraged lifestyle change  Substance and tobacco use -Counseled -Nicotine patch.   Scheduled Meds: . carvedilol  3.125 mg Oral BID WC  . enoxaparin (LOVENOX) injection  40 mg Subcutaneous Q24H  . furosemide  20 mg Oral BID  . nicotine  14 mg Transdermal Daily  . sodium chloride flush  3 mL Intravenous Q12H   Continuous Infusions: . sodium  chloride     PRN Meds:.sodium chloride, acetaminophen, sodium chloride flush  DVT prophylaxis: Lovenox Code Status: Full code Family Communication: Significant other at bedside. Disposition Plan: Remains inpatient to adjust diuretics and monitor renal function and follow-up on echocardiogram.  Consultants:   None  Procedures:   None  Antimicrobials:  None  Objective: Vitals:   03/03/18 1954 03/04/18 0008 03/04/18 0539 03/04/18 0927  BP: 131/89 125/74 119/72 126/77  Pulse: 64 63 (!) 57 64  Resp: 20 20 20 18   Temp: 97.8 F (36.6 C)  98.2 F (36.8 C)   TempSrc: Oral  Oral   SpO2: 98% 97% 98% 100%  Weight:   70.1 kg   Height:        Intake/Output Summary (Last 24 hours) at 03/04/2018 1103 Last data filed at 03/04/2018 1100 Gross per 24 hour  Intake 723 ml  Output 2400 ml  Net -1677 ml   Filed Weights   03/03/18 0532 03/03/18 0941 03/04/18 0539  Weight: 79.4 kg 71.3 kg 70.1 kg    Examination:  GENERAL: Appears well. No acute distress.  HEENT: MMM.  Vision and Hearing grossly intact.  NECK: Supple.  No JVD but some hepatojugular reflux. LUNGS:  No IWOB. Good air movement. CTAB.  HEART:  RRR. Heart sounds normal. ABD: Bowel sounds present. Soft. Non tender.  MSK/EXT:   no edema bilaterally.  SKIN: no apparent skin lesion.  NEURO: Awake, alert and oriented appropriately.  No gross deficit.  PSYCH: Calm. Normal affect.   Data Reviewed: I have independently reviewed following labs and imaging studies  CBC: Recent Labs  Lab 03/03/18 0546 03/03/18 1218  WBC 6.6 7.0  HGB 13.0 13.0  HCT 41.5 40.4  MCV 87.7 85.2  PLT 233 228   Basic Metabolic Panel: Recent Labs  Lab 03/03/18 0546 03/03/18 1218 03/04/18 0002  NA 139  --  136  K 3.6  --  3.7  CL 113*  --  102  CO2 20*  --  22  GLUCOSE 126*  --  100*  BUN 18  --  20  CREATININE 1.13 1.13 1.33*  CALCIUM 9.0  --  9.2  MG 2.1  --  1.9   GFR: Estimated Creatinine Clearance: 63 mL/min (A) (by C-G  formula based on SCr of 1.33 mg/dL (H)). Liver Function Tests: No results for input(s): AST, ALT, ALKPHOS, BILITOT, PROT, ALBUMIN in the last 168 hours. No results for input(s): LIPASE, AMYLASE in the last 168 hours. No results for input(s): AMMONIA in the last 168 hours. Coagulation Profile: Recent Labs  Lab 03/03/18 0546  INR 1.07   Cardiac Enzymes: Recent Labs  Lab 03/03/18 0546 03/03/18 1218 03/03/18 1805 03/03/18 2326  TROPONINI 0.03* <0.03 <0.03 <0.03   BNP (last 3 results) No results for input(s): PROBNP in the last 8760 hours. HbA1C: Recent Labs    03/03/18 1218  HGBA1C 5.8*   CBG: No results for input(s): GLUCAP in the last 168 hours. Lipid Profile: Recent Labs    03/03/18 1218  CHOL 169  HDL 33*  LDLCALC 118*  TRIG 91  CHOLHDL 5.1   Thyroid Function Tests: Recent Labs    03/03/18 1218  TSH 2.811   Anemia Panel: No results for input(s): VITAMINB12, FOLATE, FERRITIN, TIBC, IRON, RETICCTPCT in the last 72 hours. Urine analysis: No results found for: COLORURINE, APPEARANCEUR, LABSPEC, PHURINE, GLUCOSEU, HGBUR, BILIRUBINUR, KETONESUR, PROTEINUR, UROBILINOGEN, NITRITE, LEUKOCYTESUR Sepsis Labs: Invalid input(s): PROCALCITONIN, LACTICIDVEN  No results found for this or any previous visit (from the past 240 hour(s)).    Radiology Studies: No results found.  Aleda Madl T[facility] Pager [phone]  If 7PM-7AM, please contact night-coverage [email or web address] Password [email or web address] 03/04/2018, 11:03 AM

## 2018-03-04 NOTE — Clinical Social Work Note (Signed)
CSW acknowledges consult for medication. Will notify RNCM in morning progression meeting.  CSW signing off. Consult again if any social work needs arise.  Charlynn Court, CSW [phone]

## 2018-03-04 NOTE — Progress Notes (Signed)
  Echocardiogram 2D Echocardiogram has been performed.  Tye Savoy 03/04/2018, 1:53 PM

## 2018-03-04 NOTE — Progress Notes (Signed)
Patient has NPO order effective now for procedure tomorrow. Paged PA Sharol Harness and asked should order be active now or at midnight. Verbal order given for NPO order starting at midnight. Order adjusted.  Hatsuko Bizzarro, RN

## 2018-03-04 NOTE — Care Management Note (Signed)
Case Management Note  Patient Details  Name: Marvens Beas MRN: [id number] Date of Birth: April 20, 1965  Subjective/Objective:    CHF               Action/Plan: Patient is independent of all of his ADL's; lives at home with his girlfriend; No PCP, no medical insurance; patient stated that he has never been to a physician ( don't like them); pt stated that he will follow up at the free clinic in [facility]; he works full time as a Publishing copy full time; CM encouraged patient to try to get health insurance and the importance of follow up care; CM will continue to follow patient for progression of care.  Expected Discharge Date:  Possibly 03/06/2018            Expected Discharge Plan:  Home/Self Care  Discharge planning Services  CM Consult  Status of Service:  In process, will continue to follow Reola Mosher [phone] 03/04/2018, 11:57 AM

## 2018-03-04 NOTE — Progress Notes (Signed)
Patient refuses nicotine patch. Stated he does not need it

## 2018-03-04 NOTE — Consult Note (Addendum)
Cardiology Consultation:   Patient ID: Martin Keller MRN: [id number]; DOB: [birth year]  Admit date: 03/03/2018 Date of Consult: 03/04/2018  Primary Care Provider: Patient, No Pcp Per Primary Cardiologist: New (Dr. Allyson Sabal) Primary Electrophysiologist:  None   Patient Profile:   Martin Keller is a 52 y.o. male with a hx of h/o untreated  HTN, tobacco use and family h/o premature CAD who presented to [facility] ED on 03/03/18 with CC of dyspnea, orthopnea and PND and diagnosed with acute CHF. He is being seen today for the evaluation of systolic heart failure with reduced EF at 30-35% and severe MR, at the request of Dr. Alanda Slim, Internal Medicine.   History of Present Illness:   Martin Keller is a 52 y/o male with a h/o untreated  HTN, tobacco use and family h/o premature CAD who presented to [facility] ED on 03/03/18 with CC of dyspnea, orthopnea and PND and diagnosed with acute CHF. Echo shows reduced LVEF at 30-35%. BNP 702. Echo also shows severe MR. EKG NSR w/ LVH. He denies CP. Dyspnea has been present x 2 weeks. Dose not have a PCP. Not on any meds prior to admission. Smoker. Bad diet. Eats a lot of salt.  Family history significant for CAD. His mother died at 55 from heart attack, father died from heart attack 56, older brother with heart surgery at 69 and daughter with diabetes.  Past Medical History:  Diagnosis Date  . Hypertension     History reviewed. No pertinent surgical history.   Home Medications:  Prior to Admission medications   Not on File    Inpatient Medications: Scheduled Meds: . carvedilol  3.125 mg Oral BID WC  . enoxaparin (LOVENOX) injection  40 mg Subcutaneous Q24H  . furosemide  20 mg Oral BID  . nicotine  14 mg Transdermal Daily  . sodium chloride flush  3 mL Intravenous Q12H   Continuous Infusions: . sodium chloride     PRN Meds: sodium chloride, acetaminophen, sodium chloride flush  Allergies:   No Known Allergies  Social History:   Social History    Socioeconomic History  . Marital status: Divorced    Spouse name: Not on file  . Number of children: Not on file  . Years of education: Not on file  . Highest education level: Not on file  Occupational History  . Not on file  Social Needs  . Financial resource strain: Not on file  . Food insecurity:    Worry: Not on file    Inability: Not on file  . Transportation needs:    Medical: Not on file    Non-medical: Not on file  Tobacco Use  . Smoking status: Current Every Day Smoker    Types: Cigars  . Smokeless tobacco: Never Used  Substance and Sexual Activity  . Alcohol use: Yes    Frequency: Never  . Drug use: Yes    Types: Marijuana  . Sexual activity: Yes    Partners: Female    Birth control/protection: None  Lifestyle  . Physical activity:    Days per week: Not on file    Minutes per session: Not on file  . Stress: Not on file  Relationships  . Social connections:    Talks on phone: Not on file    Gets together: Not on file    Attends religious service: Not on file    Active member of club or organization: Not on file    Attends meetings of clubs or organizations: Not on  file    Relationship status: Not on file  . Intimate partner violence:    Fear of current or ex partner: Not on file    Emotionally abused: Not on file    Physically abused: Not on file    Forced sexual activity: Not on file  Other Topics Concern  . Not on file  Social History Narrative  . Not on file    Family History:    Family History  Problem Relation Age of Onset  . Heart attack Mother        Died at age of 34 from heart attack.  Marland Kitchen Heart attack Father        Died at age of 2 from heart attack.  Marland Kitchen Heart disease Brother        Heart surgery at age of 49.  . Diabetes Child      ROS:  Please see the history of present illness.   All other ROS reviewed and negative.     Physical Exam/Data:   Vitals:   03/04/18 0927 03/04/18 1234 03/04/18 1656 03/04/18 1721  BP: 126/77  129/86 115/74 121/84  Pulse: 64 (!) 59 66 (!) 57  Resp: 18 18 18 18   Temp:  97.9 F (36.6 C)  97.8 F (36.6 C)  TempSrc:  Oral  Oral  SpO2: 100% 99% 97% 100%  Weight:      Height:        Intake/Output Summary (Last 24 hours) at 03/04/2018 1749 Last data filed at 03/04/2018 1659 Gross per 24 hour  Intake 890 ml  Output 2650 ml  Net -1760 ml   Filed Weights   03/03/18 0532 03/03/18 0941 03/04/18 0539  Weight: 79.4 kg 71.3 kg 70.1 kg   Body mass index is 22.18 kg/m.  General:  Well nourished, well developed, in no acute distress HEENT: normal Lymph: no adenopathy Neck: no JVD Endocrine:  No thryomegaly Vascular: No carotid bruits; FA pulses 2+ bilaterally without bruits  Cardiac:  normal S1, S2; RRR; loud 3/6 MR murmur at apex Lungs:  clear to auscultation bilaterally, no wheezing, rhonchi or rales  Abd: soft, nontender, no hepatomegaly  Ext: no edema Musculoskeletal:  No deformities, BUE and BLE strength normal and equal Skin: warm and dry  Neuro:  CNs 2-12 intact, no focal abnormalities noted Psych:  Normal affect   EKG:  The EKG was personally reviewed and demonstrates:  Sinus bradycardia 58 bpm  Telemetry:  Telemetry was personally reviewed and demonstrates:  NSR.   Relevant CV Studies: 2D Echo 03/04/18 Study Conclusions  - Left ventricle: The cavity size was moderately dilated. Systolic   function was moderately to severely reduced. The estimated   ejection fraction was in the range of 30% to 35%. Diffuse   hypokinesis. There was a reduced contribution of atrial   contraction to ventricular filling, due to increased ventricular   diastolic pressure or atrial contractile dysfunction. No evidence   of thrombus. - Mitral valve: Mobility of the posterior leaflet was moderately   restricted. There was malcoaptation of the valve leaflets. There   was severe regurgitation directed centrally. The acceleration   rate of the regurgitant jet was reduced, consistent with  a low   dP/dt. Severe regurgitation is suggested by a regurgitant   fraction >= 55% and pulmonary vein systolic flow reversal.   Regurgitant fraction (PISA): 59.09%. - Left atrium: The atrium was severely dilated. - Right ventricle: Systolic function was mildly reduced. - Right atrium: The atrium was  moderately dilated. - Atrial septum: The septum bowed from left to right, consistent   with increased left atrial pressure. No defect or patent foramen   ovale was identified.  Laboratory Data:  Chemistry Recent Labs  Lab 03/03/18 0546 03/03/18 1218 03/04/18 0002  NA 139  --  136  K 3.6  --  3.7  CL 113*  --  102  CO2 20*  --  22  GLUCOSE 126*  --  100*  BUN 18  --  20  CREATININE 1.13 1.13 1.33*  CALCIUM 9.0  --  9.2  GFRNONAA >60 >60 >60  GFRAA >60 >60 >60  ANIONGAP 6  --  12    No results for input(s): PROT, ALBUMIN, AST, ALT, ALKPHOS, BILITOT in the last 168 hours. Hematology Recent Labs  Lab 03/03/18 0546 03/03/18 1218  WBC 6.6 7.0  RBC 4.73 4.74  HGB 13.0 13.0  HCT 41.5 40.4  MCV 87.7 85.2  MCH 27.5 27.4  MCHC 31.3 32.2  RDW 15.6* 15.1  PLT 233 228   Cardiac Enzymes Recent Labs  Lab 03/03/18 0546 03/03/18 1218 03/03/18 1805 03/03/18 2326  TROPONINI 0.03* <0.03 <0.03 <0.03   No results for input(s): TROPIPOC in the last 168 hours.  BNP Recent Labs  Lab 03/03/18 0546  BNP 702.3*    DDimer No results for input(s): DDIMER in the last 168 hours.  Radiology/Studies:  Dg Chest 2 View  Result Date: 03/03/2018 CLINICAL DATA:  Initial evaluation for acute shortness of breath, history of CHF. EXAM: CHEST - 2 VIEW COMPARISON:  None. FINDINGS: Cardiomegaly. Mediastinal silhouette within normal limits. Lungs normally inflated. Diffuse vascular congestion with interstitial prominence in scattered Kerley B-lines, compatible with pulmonary interstitial edema. No focal infiltrates. No significant pleural effusion. No pneumothorax. No acute osseus abnormality.  IMPRESSION: Cardiomegaly with mild diffuse pulmonary interstitial edema, compatible with acute CHF exacerbation. Electronically Signed   By: Rise Mu M.D.   On: 03/03/2018 06:30    Assessment and Plan:   Martin Keller is a 52 y.o. male with a hx of h/o untreated  HTN, tobacco use and family h/o premature CAD who presented to [facility] ED on 03/03/18 with CC of dyspnea, orthopnea and PND and diagnosed with acute CHF. He is being seen today for the evaluation of systolic heart failure with reduced EF at 30-35% and severe MR, at the request of Dr. Alanda Slim, Internal Medicine.   1. Acute CHF/ Cardiomyopathy: classic symptoms. Worsening dyspnea, orthopnea and PND x 2 weeks. No CP. Found to be in acute CHF in the setting of long standing untreated hypertension. EF 30-35% on echo and severe MR. Continue diuresis. Will set up for [facility] tomorrow to r/o CAD. He will likely need MV repair vs replacement +/- CABG. We had long discussion regarding medication compliance, low salt diet and medical f/u post hospital as well as smoking cessation.   2. HTN: better controlled. BB was initated by primary team. BP 121/84. Will try to add ARB vs Entresto post cath, pending renal function. SCr is 1.33 today.   3. Mitral Regurgitation: severe MR noted on echo. Plan [facility] tomorrow. Will likely need repair vs replacement +/- CABG.   4. Tobacco Abuse: smoking cessation advised.   For questions or updates, please contact [facility] Please consult [email or web address] for contact info under     Signed, Robbie Lis, PA-C  03/04/2018 5:49 PM   Agree with note written by Boyce Medici  [facility]  We are asked to see Martin Keller  for systolic heart failure.  He was admitted yesterday with increasing shortness of breath for the last 2 weeks.  His BNP was elevated.  Troponins are negative.  His EKG is remarkable for LVH but otherwise no acute changes.  He does have an EF of 30 to 35% with severe mitral regurgitation.  His risk  factors include untreated hypertension and tobacco abuse as well as premature family history of heart disease.  He does have a loud MR murmur at his apex as well as mild elevation in jugular venous pressure.  He has been diuresed over 2 L since admission.  Low-dose carvedilol was started as well as oral furosemide.  He will probably ultimately also need to be on ACE and/or ARB.  He will need a right left heart cath to define his anatomy and physiology. I have reviewed the risks, indications, and alternatives to cardiac catheterization, possible angioplasty, and stenting with the patient. Risks include but are not limited to bleeding, infection, vascular injury, stroke, myocardial infection, arrhythmia, kidney injury, radiation-related injury in the case of prolonged fluoroscopy use, emergency cardiac surgery, and death. The patient understands the risks of serious complication is 1-2 in 1000 with diagnostic cardiac cath and 1-2% or less with angioplasty/stenting.    Nanetta Batty 03/04/2018 6:28 PM

## 2018-03-05 ENCOUNTER — Encounter (HOSPITAL_COMMUNITY)
Admission: EM | Disposition: A | Discharge status: Home / Self Care | Payer: Self-pay | Source: Home / Self Care | Attending: Student

## 2018-03-05 ENCOUNTER — Encounter (HOSPITAL_COMMUNITY): Payer: Self-pay | Admitting: Internal Medicine

## 2018-03-05 HISTORY — PX: RIGHT/LEFT HEART CATH AND CORONARY ANGIOGRAPHY: CATH118266

## 2018-03-05 LAB — POCT I-STAT 3, VENOUS BLOOD GAS (G3P V)
Acid-base deficit: 1 mmol/L (ref 0.0–2.0)
Acid-base deficit: 1 mmol/L (ref 0.0–2.0)
BICARBONATE: 23.1 mmol/L (ref 20.0–28.0)
Bicarbonate: 23.1 mmol/L (ref 20.0–28.0)
Bicarbonate: 26.2 mmol/L (ref 20.0–28.0)
Bicarbonate: 26.2 mmol/L (ref 20.0–28.0)
O2 SAT: 67 %
O2 SAT: 70 %
O2 Saturation: 84 %
O2 Saturation: 84 %
PCO2 VEN: 36.6 mmHg — AB (ref 44.0–60.0)
PCO2 VEN: 46.1 mmHg (ref 44.0–60.0)
PH VEN: 7.408 (ref 7.250–7.430)
PO2 VEN: 49 mmHg — AB (ref 32.0–45.0)
TCO2: 24 mmol/L (ref 22–32)
TCO2: 24 mmol/L (ref 22–32)
TCO2: 28 mmol/L (ref 22–32)
TCO2: 28 mmol/L (ref 22–32)
pCO2, Ven: 37.3 mmHg — ABNORMAL LOW (ref 44.0–60.0)
pCO2, Ven: 45.5 mmHg (ref 44.0–60.0)
pH, Ven: 7.363 (ref 7.250–7.430)
pH, Ven: 7.368 (ref 7.250–7.430)
pH, Ven: 7.4 (ref 7.250–7.430)
pO2, Ven: 37 mmHg (ref 32.0–45.0)
pO2, Ven: 38 mmHg (ref 32.0–45.0)
pO2, Ven: 48 mmHg — ABNORMAL HIGH (ref 32.0–45.0)

## 2018-03-05 LAB — BASIC METABOLIC PANEL
Anion gap: 11 (ref 5–15)
BUN: 15 mg/dL (ref 6–20)
CO2: 22 mmol/L (ref 22–32)
CREATININE: 1.15 mg/dL (ref 0.61–1.24)
Calcium: 9.1 mg/dL (ref 8.9–10.3)
Chloride: 105 mmol/L (ref 98–111)
GFR calc Af Amer: >60 mL/min (ref >60)
GFR calc non Af Amer: >60 mL/min (ref >60)
Glucose, Bld: 97 mg/dL (ref 70–99)
Potassium: 3.6 mmol/L (ref 3.5–5.1)
Sodium: 138 mmol/L (ref 135–145)

## 2018-03-05 LAB — POCT I-STAT 3, ART BLOOD GAS (G3+)
Acid-base deficit: 2 mmol/L (ref 0.0–2.0)
BICARBONATE: 21.8 mmol/L (ref 20.0–28.0)
O2 Saturation: 97 %
TCO2: 23 mmol/L (ref 22–32)
pCO2 arterial: 33.7 mmHg (ref 32.0–48.0)
pH, Arterial: 7.418 (ref 7.350–7.450)
pO2, Arterial: 85 mmHg (ref 83.0–108.0)

## 2018-03-05 LAB — MAGNESIUM: Magnesium: 2 mg/dL (ref 1.7–2.4)

## 2018-03-05 SURGERY — RIGHT/LEFT HEART CATH AND CORONARY ANGIOGRAPHY
Anesthesia: LOCAL

## 2018-03-05 MED ORDER — IOHEXOL 350 MG/ML SOLN
INTRAVENOUS | Status: DC | PRN
  Administered 2018-03-05: 90 mL via INTRACARDIAC

## 2018-03-05 MED ORDER — LIDOCAINE HCL (PF) 1 % IJ SOLN
INTRAMUSCULAR | Status: AC
  Filled 2018-03-05: qty 30

## 2018-03-05 MED ORDER — FENTANYL CITRATE (PF) 100 MCG/2ML IJ SOLN
INTRAMUSCULAR | Status: DC | PRN
  Administered 2018-03-05: 25 ug via INTRAVENOUS

## 2018-03-05 MED ORDER — SODIUM CHLORIDE 0.9% FLUSH: 3.0000 mL | INTRAVENOUS | Status: DC | PRN

## 2018-03-05 MED ORDER — HEPARIN SODIUM (PORCINE) 1000 UNIT/ML IJ SOLN
INTRAMUSCULAR | Status: DC | PRN
  Administered 2018-03-05: 3500 [IU] via INTRAVENOUS

## 2018-03-05 MED ORDER — FENTANYL CITRATE (PF) 100 MCG/2ML IJ SOLN
INTRAMUSCULAR | Status: AC
  Filled 2018-03-05: qty 2

## 2018-03-05 MED ORDER — MIDAZOLAM HCL 2 MG/2ML IJ SOLN
INTRAMUSCULAR | Status: DC | PRN
  Administered 2018-03-05: 2 mg via INTRAVENOUS

## 2018-03-05 MED ORDER — HEPARIN (PORCINE) IN NACL 1000-0.9 UT/500ML-% IV SOLN
INTRAVENOUS | Status: DC | PRN
  Administered 2018-03-05 (×2): 500 mL

## 2018-03-05 MED ORDER — ENOXAPARIN SODIUM 40 MG/0.4ML ~~LOC~~ SOLN
40.0000 mg | SUBCUTANEOUS | Status: DC
  Administered 2018-03-06 – 2018-03-07 (×2): 40 mg via SUBCUTANEOUS
  Filled 2018-03-05 (×2): qty 0.4

## 2018-03-05 MED ORDER — SODIUM CHLORIDE 0.9 % IV SOLN: 250.0000 mL | INTRAVENOUS | Status: DC | PRN

## 2018-03-05 MED ORDER — VERAPAMIL HCL 2.5 MG/ML IV SOLN
INTRAVENOUS | Status: DC | PRN
  Administered 2018-03-05: 10 mL via INTRA_ARTERIAL

## 2018-03-05 MED ORDER — SODIUM CHLORIDE 0.9 % IV SOLN: INTRAVENOUS | Status: AC

## 2018-03-05 MED ORDER — HEPARIN (PORCINE) IN NACL 1000-0.9 UT/500ML-% IV SOLN
INTRAVENOUS | Status: AC
  Filled 2018-03-05: qty 1000

## 2018-03-05 MED ORDER — SODIUM CHLORIDE 0.9% FLUSH
3.0000 mL | Freq: Two times a day (BID) | INTRAVENOUS | Status: DC
  Administered 2018-03-06 (×2): 3 mL via INTRAVENOUS

## 2018-03-05 MED ORDER — MIDAZOLAM HCL 2 MG/2ML IJ SOLN
INTRAMUSCULAR | Status: AC
  Filled 2018-03-05: qty 2

## 2018-03-05 MED ORDER — ACETAMINOPHEN 325 MG PO TABS: 650.0000 mg | ORAL_TABLET | ORAL | Status: DC | PRN

## 2018-03-05 MED ORDER — LIDOCAINE HCL (PF) 1 % IJ SOLN
INTRAMUSCULAR | Status: DC | PRN
  Administered 2018-03-05 (×2): 1 mL

## 2018-03-05 MED ORDER — VERAPAMIL HCL 2.5 MG/ML IV SOLN
INTRAVENOUS | Status: AC
  Filled 2018-03-05: qty 2

## 2018-03-05 SURGICAL SUPPLY — 12 items
CATH 5FR JL3.5 JR4 ANG PIG MP (CATHETERS) ×2 IMPLANT
CATH BALLN WEDGE 5F 110CM (CATHETERS) ×2 IMPLANT
DEVICE RAD COMP TR BAND LRG (VASCULAR PRODUCTS) ×2 IMPLANT
GLIDESHEATH SLEND SS 6F .021 (SHEATH) ×2 IMPLANT
GUIDEWIRE INQWIRE 1.5J.035X260 (WIRE) ×1 IMPLANT
INQWIRE 1.5J .035X260CM (WIRE) ×2
KIT HEART LEFT (KITS) ×2 IMPLANT
PACK CARDIAC CATHETERIZATION (CUSTOM PROCEDURE TRAY) ×2 IMPLANT
SHEATH GLIDE SLENDER 4/5FR (SHEATH) ×2 IMPLANT
SYR MEDRAD MARK 7 150ML (SYRINGE) ×2 IMPLANT
TRANSDUCER W/STOPCOCK (MISCELLANEOUS) ×2 IMPLANT
TUBING CIL FLEX 10 FLL-RA (TUBING) ×2 IMPLANT

## 2018-03-05 NOTE — Research (Signed)
PHDEV Informed Consent   Subject Name: Martin Keller  Subject met inclusion and exclusion criteria.  The informed consent form, study requirements and expectations were reviewed with the subject and questions and concerns were addressed prior to the signing of the consent form.  The subject verbalized understanding of the trail requirements.  The subject agreed to participate in the [facility] trial and signed the informed consent.  The informed consent was obtained prior to performance of any protocol-specific procedures for the subject.  A copy of the signed informed consent was given to the subject and a copy was placed in the subject's medical record.  [name] 03/05/2018, 1040

## 2018-03-05 NOTE — Progress Notes (Addendum)
Progress Note  Patient Name: Martin Keller Date of Encounter: 03/05/2018  Primary Cardiologist: Nanetta Batty, MD   Subjective   Feeling well. No chest pain, sob or palpitations.   Inpatient Medications    Scheduled Meds: . carvedilol  3.125 mg Oral BID WC  . enoxaparin (LOVENOX) injection  40 mg Subcutaneous Q24H  . furosemide  20 mg Oral BID  . nicotine  14 mg Transdermal Daily  . sodium chloride flush  3 mL Intravenous Q12H  . sodium chloride flush  3 mL Intravenous Q12H   Continuous Infusions: . sodium chloride    . sodium chloride    . sodium chloride 10 mL/hr at 03/05/18 0606   PRN Meds: sodium chloride, sodium chloride, acetaminophen, sodium chloride flush, sodium chloride flush   Vital Signs    Vitals:   03/04/18 2033 03/05/18 0042 03/05/18 0510 03/05/18 0854  BP: 130/76 127/78 126/84 127/83  Pulse: 62 63 (!) 58 (!) 57  Resp: 20  20   Temp: 97.7 F (36.5 C) (!) 97.5 F (36.4 C) (!) 97.5 F (36.4 C)   TempSrc: Oral Oral Oral   SpO2: 99% 98% 98%   Weight:   70.6 kg   Height:        Intake/Output Summary (Last 24 hours) at 03/05/2018 1105 Last data filed at 03/05/2018 0649 Gross per 24 hour  Intake 416.78 ml  Output 650 ml  Net -233.22 ml   Filed Weights   03/03/18 0941 03/04/18 0539 03/05/18 0510  Weight: 71.3 kg 70.1 kg 70.6 kg    Telemetry    SR - Personally Reviewed  ECG    N/A  Physical Exam   GEN: No acute distress.   Neck: No JVD Cardiac: RRR, 3/6 systolic murmurs, rubs, or gallops.  Respiratory: Clear to auscultation bilaterally. GI: Soft, nontender, non-distended  MS: No edema; No deformity. Neuro:  Nonfocal  Psych: Normal affect   Labs    Chemistry Recent Labs  Lab 03/03/18 0546 03/03/18 1218 03/04/18 0002 03/05/18 0501  NA 139  --  136 138  K 3.6  --  3.7 3.6  CL 113*  --  102 105  CO2 20*  --  22 22  GLUCOSE 126*  --  100* 97  BUN 18  --  20 15  CREATININE 1.13 1.13 1.33* 1.15  CALCIUM 9.0  --  9.2 9.1    GFRNONAA >60 >60 >60 >60  GFRAA >60 >60 >60 >60  ANIONGAP 6  --  12 11     Hematology Recent Labs  Lab 03/03/18 0546 03/03/18 1218  WBC 6.6 7.0  RBC 4.73 4.74  HGB 13.0 13.0  HCT 41.5 40.4  MCV 87.7 85.2  MCH 27.5 27.4  MCHC 31.3 32.2  RDW 15.6* 15.1  PLT 233 228    Cardiac Enzymes Recent Labs  Lab 03/03/18 0546 03/03/18 1218 03/03/18 1805 03/03/18 2326  TROPONINI 0.03* <0.03 <0.03 <0.03   No results for input(s): TROPIPOC in the last 168 hours.   BNP Recent Labs  Lab 03/03/18 0546  BNP 702.3*     DDimer No results for input(s): DDIMER in the last 168 hours.   Radiology    No results found.  Cardiac Studies   2D Echo 03/04/18 Study Conclusions  - Left ventricle: The cavity size was moderately dilated. Systolic function was moderately to severely reduced. The estimated ejection fraction was in the range of 30% to 35%. Diffuse hypokinesis. There was a reduced contribution of atrial contraction  to ventricular filling, due to increased ventricular diastolic pressure or atrial contractile dysfunction. No evidence of thrombus. - Mitral valve: Mobility of the posterior leaflet was moderately restricted. There was malcoaptation of the valve leaflets. There was severe regurgitation directed centrally. The acceleration rate of the regurgitant jet was reduced, consistent with a low dP/dt. Severe regurgitation is suggested by a regurgitant fraction >= 55% and pulmonary vein systolic flow reversal. Regurgitant fraction (PISA): 59.09%. - Left atrium: The atrium was severely dilated. - Right ventricle: Systolic function was mildly reduced. - Right atrium: The atrium was moderately dilated. - Atrial septum: The septum bowed from left to right, consistent with increased left atrial pressure. No defect or patent foramen ovale was identified.  Patient Profile     Mars Burczyk is a 52 y.o. male with a hx of h/o untreated  HTN, tobacco  use and family h/o premature CAD who presented to [facility] ED on 03/03/18 with CC of dyspnea, orthopnea and PND and diagnosed with acute CHF. Seen for the evaluation of systolic heart failure with reduced EF at 30-35% and severe MR, at the request of Dr. Alanda Slim, Internal Medicine.   Assessment & Plan    1. Acute systolic CHF/Cardiomyopathy - Symptoms improved with diuresis. Net I & O 2.5L negative. EF 30-35% on echo and severe MR. Plan for L & R cath today. Euvolemic. Lasix held today.  - Continue BB. Will try to add ARB vs Entresto post cath, pending renal function.   2. Mitral Regurgitation - Severe MR noted on echo.  R/LHC today. Will likely need repair vs replacement +/- CABG.   3. HTN - BP improved on current medications  4. Tobacco abuse - Cessation advised   For questions or updates, please contact [facility] Please consult [email or web address] for contact info under        Signed, Manson Passey, PA  03/05/2018, 11:05 AM    Agree with note by Vin Bhagat PA-C  Good diuresis.  For right and left heart cath today.   Runell Gess, M.D., FACP, [facility], [facility] [address] [place], [place]  [address]  [phone] 03/05/2018 12:52 PM

## 2018-03-05 NOTE — H&P (View-Only) (Signed)
Progress Note  Patient Name: Martin Keller Date of Encounter: 03/05/2018  Primary Cardiologist: Nanetta Batty, MD   Subjective   Feeling well. No chest pain, sob or palpitations.   Inpatient Medications    Scheduled Meds: . carvedilol  3.125 mg Oral BID WC  . enoxaparin (LOVENOX) injection  40 mg Subcutaneous Q24H  . furosemide  20 mg Oral BID  . nicotine  14 mg Transdermal Daily  . sodium chloride flush  3 mL Intravenous Q12H  . sodium chloride flush  3 mL Intravenous Q12H   Continuous Infusions: . sodium chloride    . sodium chloride    . sodium chloride 10 mL/hr at 03/05/18 0606   PRN Meds: sodium chloride, sodium chloride, acetaminophen, sodium chloride flush, sodium chloride flush   Vital Signs    Vitals:   03/04/18 2033 03/05/18 0042 03/05/18 0510 03/05/18 0854  BP: 130/76 127/78 126/84 127/83  Pulse: 62 63 (!) 58 (!) 57  Resp: 20  20   Temp: 97.7 F (36.5 C) (!) 97.5 F (36.4 C) (!) 97.5 F (36.4 C)   TempSrc: Oral Oral Oral   SpO2: 99% 98% 98%   Weight:   70.6 kg   Height:        Intake/Output Summary (Last 24 hours) at 03/05/2018 1105 Last data filed at 03/05/2018 0649 Gross per 24 hour  Intake 416.78 ml  Output 650 ml  Net -233.22 ml   Filed Weights   03/03/18 0941 03/04/18 0539 03/05/18 0510  Weight: 71.3 kg 70.1 kg 70.6 kg    Telemetry    SR - Personally Reviewed  ECG    N/A  Physical Exam   GEN: No acute distress.   Neck: No JVD Cardiac: RRR, 3/6 systolic murmurs, rubs, or gallops.  Respiratory: Clear to auscultation bilaterally. GI: Soft, nontender, non-distended  MS: No edema; No deformity. Neuro:  Nonfocal  Psych: Normal affect   Labs    Chemistry Recent Labs  Lab 03/03/18 0546 03/03/18 1218 03/04/18 0002 03/05/18 0501  NA 139  --  136 138  K 3.6  --  3.7 3.6  CL 113*  --  102 105  CO2 20*  --  22 22  GLUCOSE 126*  --  100* 97  BUN 18  --  20 15  CREATININE 1.13 1.13 1.33* 1.15  CALCIUM 9.0  --  9.2 9.1    GFRNONAA >60 >60 >60 >60  GFRAA >60 >60 >60 >60  ANIONGAP 6  --  12 11     Hematology Recent Labs  Lab 03/03/18 0546 03/03/18 1218  WBC 6.6 7.0  RBC 4.73 4.74  HGB 13.0 13.0  HCT 41.5 40.4  MCV 87.7 85.2  MCH 27.5 27.4  MCHC 31.3 32.2  RDW 15.6* 15.1  PLT 233 228    Cardiac Enzymes Recent Labs  Lab 03/03/18 0546 03/03/18 1218 03/03/18 1805 03/03/18 2326  TROPONINI 0.03* <0.03 <0.03 <0.03   No results for input(s): TROPIPOC in the last 168 hours.   BNP Recent Labs  Lab 03/03/18 0546  BNP 702.3*     DDimer No results for input(s): DDIMER in the last 168 hours.   Radiology    No results found.  Cardiac Studies   2D Echo 03/04/18 Study Conclusions  - Left ventricle: The cavity size was moderately dilated. Systolic function was moderately to severely reduced. The estimated ejection fraction was in the range of 30% to 35%. Diffuse hypokinesis. There was a reduced contribution of atrial contraction  to ventricular filling, due to increased ventricular diastolic pressure or atrial contractile dysfunction. No evidence of thrombus. - Mitral valve: Mobility of the posterior leaflet was moderately restricted. There was malcoaptation of the valve leaflets. There was severe regurgitation directed centrally. The acceleration rate of the regurgitant jet was reduced, consistent with a low dP/dt. Severe regurgitation is suggested by a regurgitant fraction >= 55% and pulmonary vein systolic flow reversal. Regurgitant fraction (PISA): 59.09%. - Left atrium: The atrium was severely dilated. - Right ventricle: Systolic function was mildly reduced. - Right atrium: The atrium was moderately dilated. - Atrial septum: The septum bowed from left to right, consistent with increased left atrial pressure. No defect or patent foramen ovale was identified.  Patient Profile     Martin Keller is a 52 y.o. male with a hx of h/o untreated  HTN, tobacco  use and family h/o premature CAD who presented to [facility] ED on 03/03/18 with CC of dyspnea, orthopnea and PND and diagnosed with acute CHF. Seen for the evaluation of systolic heart failure with reduced EF at 30-35% and severe MR, at the request of Dr. Alanda Slim, Internal Medicine.   Assessment & Plan    1. Acute systolic CHF/Cardiomyopathy - Symptoms improved with diuresis. Net I & O 2.5L negative. EF 30-35% on echo and severe MR. Plan for L & R cath today. Euvolemic. Lasix held today.  - Continue BB. Will try to add ARB vs Entresto post cath, pending renal function.   2. Mitral Regurgitation - Severe MR noted on echo.  R/LHC today. Will likely need repair vs replacement +/- CABG.   3. HTN - BP improved on current medications  4. Tobacco abuse - Cessation advised   For questions or updates, please contact [facility] Please consult [email or web address] for contact info under        Signed, Manson Passey, PA  03/05/2018, 11:05 AM    Agree with note by Vin Bhagat PA-C  Good diuresis.  For right and left heart cath today.   Runell Gess, M.D., FACP, [facility], [facility] [address] [place], [place]  [address]  [phone] 03/05/2018 12:52 PM

## 2018-03-05 NOTE — Progress Notes (Signed)
PROGRESS NOTE  Martin Keller RUE:[id number] DOB: 04-15-1965 DOA: 03/03/2018 PCP: Patient, No Pcp Per   LOS: 2 days   Brief Narrative / Interim history: 52 y.o. male with history of hypertension not on any medication, heart murmur and tobacco use who was admitted with dyspnea and orthopnea due to new CHF.  Chest x-ray with pulmonary congestion.  BNP elevated to 700.  Started on IV Lasix 20 mg twice daily with good urine output and improvement in his symptoms but mild creatinine uptrend.  Transitioned to oral Lasix 20 mg twice daily on 03/04/2018.    Subjective: No major events overnight.  Denies dyspnea, orthopnea, chest pain or palpitation.  Lying flat in bed.  Fair urine output.  Aware of cardiology plans today.  Assessment & Plan: Active Problems:   CHF (congestive heart failure) (HCC)   New onset of congestive heart failure (HCC)   Essential hypertension   Elevated troponin   Prolonged QT interval  New onset systolic CHF: Echo with EF of 30 to 35%, diffuse hypokinesis, severe MR, severely dilated LA and moderately dilated RA.  BNP elevated to 700 (no prior to compare to).  Chest x-ray with pulmonary vascular congestion.  Fair response to oral diuretics.  Currently asymptomatic.  Renal function improved. -Cardiology on board. -Left and right heart cath today. -Continue p.o. Lasix -Continue low-dose Coreg -Risk labs including A1c, thyroid function and lipid panel not impressive. -Monitor daily weight, intake output and renal function.  Borderline troponin:  Resolved.  Likely demand ischemia.  He has no chest pain.  EKG with nonspecific T wave changes and possible LAE. -Beta-blocker as above  Hypertension:  Normotensive. -Meds as above.  Prediabetes: BMP glucose within appropriate range -Encouraged lifestyle change  Substance and tobacco use -Counseled -Nicotine patch.  Scheduled Meds: . carvedilol  3.125 mg Oral BID WC  . enoxaparin (LOVENOX) injection  40 mg  Subcutaneous Q24H  . furosemide  20 mg Oral BID  . nicotine  14 mg Transdermal Daily  . sodium chloride flush  3 mL Intravenous Q12H  . sodium chloride flush  3 mL Intravenous Q12H   Continuous Infusions: . sodium chloride    . sodium chloride    . sodium chloride 10 mL/hr at 03/05/18 0606   PRN Meds:.sodium chloride, sodium chloride, acetaminophen, sodium chloride flush, sodium chloride flush  DVT prophylaxis: Lovenox Code Status: Full code Family Communication: Significant other at bedside. Disposition Plan: Remains inpatient.  Left and right heart catheterization today.  Consultants:   None  Procedures:   None  Antimicrobials:  None  Objective: Vitals:   03/04/18 2033 03/05/18 0042 03/05/18 0510 03/05/18 0854  BP: 130/76 127/78 126/84 127/83  Pulse: 62 63 (!) 58 (!) 57  Resp: 20  20   Temp: 97.7 F (36.5 C) (!) 97.5 F (36.4 C) (!) 97.5 F (36.4 C)   TempSrc: Oral Oral Oral   SpO2: 99% 98% 98%   Weight:   70.6 kg   Height:        Intake/Output Summary (Last 24 hours) at 03/05/2018 1216 Last data filed at 03/05/2018 0649 Gross per 24 hour  Intake 416.78 ml  Output 650 ml  Net -233.22 ml   Filed Weights   03/03/18 0941 03/04/18 0539 03/05/18 0510  Weight: 71.3 kg 70.1 kg 70.6 kg    Examination:  GENERAL: Appears well. No acute distress.  HEENT: MMM.  Vision and Hearing grossly intact.  NECK: Supple.  No JVD.  LUNGS:  No IWOB. Good air  movement. CTAB.  HEART:  RRR. 2/6 SEM over apex. ABD: Bowel sounds present. Soft. Non tender.  EXT:   no edema bilaterally.  SKIN: no apparent skin lesion.  NEURO: Awake, alert and oriented appropriately.  No gross deficit.  PSYCH: Calm. Normal affect.    Data Reviewed: I have independently reviewed following labs and imaging studies   CBC: Recent Labs  Lab 03/03/18 0546 03/03/18 1218  WBC 6.6 7.0  HGB 13.0 13.0  HCT 41.5 40.4  MCV 87.7 85.2  PLT 233 228   Basic Metabolic Panel: Recent Labs  Lab  03/03/18 0546 03/03/18 1218 03/04/18 0002 03/05/18 0501  NA 139  --  136 138  K 3.6  --  3.7 3.6  CL 113*  --  102 105  CO2 20*  --  22 22  GLUCOSE 126*  --  100* 97  BUN 18  --  20 15  CREATININE 1.13 1.13 1.33* 1.15  CALCIUM 9.0  --  9.2 9.1  MG 2.1  --  1.9 2.0   GFR: Estimated Creatinine Clearance: 73.3 mL/min (by C-G formula based on SCr of 1.15 mg/dL). Liver Function Tests: No results for input(s): AST, ALT, ALKPHOS, BILITOT, PROT, ALBUMIN in the last 168 hours. No results for input(s): LIPASE, AMYLASE in the last 168 hours. No results for input(s): AMMONIA in the last 168 hours. Coagulation Profile: Recent Labs  Lab 03/03/18 0546  INR 1.07   Cardiac Enzymes: Recent Labs  Lab 03/03/18 0546 03/03/18 1218 03/03/18 1805 03/03/18 2326  TROPONINI 0.03* <0.03 <0.03 <0.03   BNP (last 3 results) No results for input(s): PROBNP in the last 8760 hours. HbA1C: Recent Labs    03/03/18 1218  HGBA1C 5.8*   CBG: No results for input(s): GLUCAP in the last 168 hours. Lipid Profile: Recent Labs    03/03/18 1218  CHOL 169  HDL 33*  LDLCALC 118*  TRIG 91  CHOLHDL 5.1   Thyroid Function Tests: Recent Labs    03/03/18 1218  TSH 2.811   Anemia Panel: No results for input(s): VITAMINB12, FOLATE, FERRITIN, TIBC, IRON, RETICCTPCT in the last 72 hours. Urine analysis: No results found for: COLORURINE, APPEARANCEUR, LABSPEC, PHURINE, GLUCOSEU, HGBUR, BILIRUBINUR, KETONESUR, PROTEINUR, UROBILINOGEN, NITRITE, LEUKOCYTESUR Sepsis Labs: Invalid input(s): PROCALCITONIN, LACTICIDVEN  No results found for this or any previous visit (from the past 240 hour(s)).    Radiology Studies: No results found.  Martin Keller T. [facility] Pager [phone]  If 7PM-7AM, please contact night-coverage [email or web address] Password [email or web address] 03/05/2018, 12:16 PM

## 2018-03-05 NOTE — Interval H&P Note (Signed)
History and Physical Interval Note:  03/05/2018 2:25 PM  Martin Keller  has presented today for surgery, with the diagnosis of unstable angina  The various methods of treatment have been discussed with the patient and family. After consideration of risks, benefits and other options for treatment, the patient has consented to  Procedure(s): RIGHT/LEFT HEART CATH AND CORONARY ANGIOGRAPHY (N/A) and possible coronary angioplasty as a surgical intervention .  The patient's history has been reviewed, patient examined, no change in status, stable for surgery.  I have reviewed the patient's chart and labs.  Questions were answered to the patient's satisfaction.     Belma Dyches

## 2018-03-06 DIAGNOSIS — I5043 Acute on chronic combined systolic (congestive) and diastolic (congestive) heart failure: Secondary | ICD-10-CM

## 2018-03-06 LAB — BASIC METABOLIC PANEL
Anion gap: 12 (ref 5–15)
BUN: 19 mg/dL (ref 6–20)
CO2: 23 mmol/L (ref 22–32)
CREATININE: 1.2 mg/dL (ref 0.61–1.24)
Calcium: 9.1 mg/dL (ref 8.9–10.3)
Chloride: 104 mmol/L (ref 98–111)
GFR calc Af Amer: >60 mL/min (ref >60)
GFR calc non Af Amer: >60 mL/min (ref >60)
Glucose, Bld: 121 mg/dL — ABNORMAL HIGH (ref 70–99)
Potassium: 3.7 mmol/L (ref 3.5–5.1)
Sodium: 139 mmol/L (ref 135–145)

## 2018-03-06 LAB — MAGNESIUM: Magnesium: 1.9 mg/dL (ref 1.7–2.4)

## 2018-03-06 MED ORDER — FUROSEMIDE 10 MG/ML IJ SOLN
40.0000 mg | Freq: Once | INTRAMUSCULAR | Status: AC
  Administered 2018-03-06: 40 mg via INTRAVENOUS
  Filled 2018-03-06: qty 4

## 2018-03-06 MED ORDER — SPIRONOLACTONE 12.5 MG HALF TABLET
12.5000 mg | ORAL_TABLET | Freq: Every day | ORAL | Status: DC
  Administered 2018-03-06 – 2018-03-07 (×2): 12.5 mg via ORAL
  Filled 2018-03-06 (×2): qty 1

## 2018-03-06 MED ORDER — POTASSIUM CHLORIDE CRYS ER 20 MEQ PO TBCR: 20.0000 meq | EXTENDED_RELEASE_TABLET | Freq: Every day | ORAL | Status: DC

## 2018-03-06 MED ORDER — MAGNESIUM SULFATE 2 GM/50ML IV SOLN
2.0000 g | Freq: Once | INTRAVENOUS | Status: AC
  Administered 2018-03-06: 2 g via INTRAVENOUS
  Filled 2018-03-06: qty 50

## 2018-03-06 MED ORDER — FUROSEMIDE 40 MG PO TABS
40.0000 mg | ORAL_TABLET | Freq: Every day | ORAL | Status: DC
  Administered 2018-03-07: 40 mg via ORAL
  Filled 2018-03-06: qty 1

## 2018-03-06 MED ORDER — LOSARTAN POTASSIUM 25 MG PO TABS
25.0000 mg | ORAL_TABLET | Freq: Every day | ORAL | Status: DC
  Administered 2018-03-06 – 2018-03-07 (×2): 25 mg via ORAL
  Filled 2018-03-06 (×2): qty 1

## 2018-03-06 NOTE — Progress Notes (Signed)
Patient has post hospital follow-up appt in the AHF clinic on 12/20 at 11:30 AM.

## 2018-03-06 NOTE — Progress Notes (Signed)
Refusing nicotine patch, patient educated prior

## 2018-03-06 NOTE — Progress Notes (Signed)
PROGRESS NOTE    Bedford Winsor  ZOX:[id number] DOB: [birth year] DOA: 03/03/2018 PCP: Patient, No Pcp Per  Brief Narrative:52 y.o.malewith history ofhypertension not on any medication,heart murmur andtobacco use who was admitted with dyspnea and orthopnea due to new CHF.  Chest x-ray with pulmonary congestion.  BNP elevated to 700.  Started on IV Lasix 20 mg twice daily with good urine output and improvement in his symptoms but mild creatinine uptrend.  Transitioned to oral Lasix 20 mg twice daily on 03/04/2018.   Assessment & Plan:   #1 new onset systolic CHF with ejection fraction 30 to 35% with diffuse hypokinesis and severe MR severely dilated left atrium with more dilated right atrium.  Cardiac cardiac cath yesterday shows normal coronaries and nonischemic cardiomyopathy with ejection fraction 20 to 25%.  He also has 3-4+ severe MR.  Patient to have TEE tomorrow morning.  Patient has history of tobacco use, THC, and alcohol.  #2 hypertension currently stable on medications.  At the time of admission his blood pressure was one 158/114 and he was not taking any medications prior to admission to the hospital.  Estimated body mass index is 22.54 kg/m as calculated from the following:   Height as of this encounter: 5\' 10"  (1.778 m).   Weight as of this encounter: 71.3 kg.  DVT prophylaxis: Lovenox Code Status: Full code Family Communication: Significant other in the room Disposition Plan: Plan for TEE tomorrow disposition per cardiology. Consultants: Cardiology  Procedures: 03/05/2018  cath Antimicrobials: None Subjective: Patient resting in bed head elevated girlfriend by the bedside denies any shortness of breath he reports that he was he is walking in the room denies any chest pain   Objective: Vitals:   03/05/18 2055 03/06/18 0619 03/06/18 0856 03/06/18 1208  BP: 122/72 120/83 132/85 121/83  Pulse: 64 62 61 63  Resp: 20 20 18 18   Temp:  98.1 F (36.7 C)  97.7 F (36.5 C)    TempSrc:  Oral  Oral  SpO2: 100% 98% 100% 99%  Weight:  71.3 kg    Height:        Intake/Output Summary (Last 24 hours) at 03/06/2018 1420 Last data filed at 03/06/2018 1254 Gross per 24 hour  Intake 720 ml  Output 900 ml  Net -180 ml   Filed Weights   03/04/18 0539 03/05/18 0510 03/06/18 0619  Weight: 70.1 kg 70.6 kg 71.3 kg    Examination:  General exam: Appears calm and comfortable  Respiratory system: Clear to auscultation. Respiratory effort normal. Cardiovascular system: S1 & S2 heard, RRR. No JVD, murmurs, rubs, gallops or clicks. No pedal edema.  Diminished breath sounds at the bases Gastrointestinal system: Abdomen is nondistended, soft and nontender. No organomegaly or masses felt. Normal bowel sounds heard. Central nervous system: Alert and oriented. No focal neurological deficits. Extremities: Symmetric 5 x 5 power. Skin: No rashes, lesions or ulcers Psychiatry: Judgement and insight appear normal. Mood & affect appropriate.     Data Reviewed: I have personally reviewed following labs and imaging studies  CBC: Recent Labs  Lab 03/03/18 0546 03/03/18 1218  WBC 6.6 7.0  HGB 13.0 13.0  HCT 41.5 40.4  MCV 87.7 85.2  PLT 233 228   Basic Metabolic Panel: Recent Labs  Lab 03/03/18 0546 03/03/18 1218 03/04/18 0002 03/05/18 0501 03/06/18 0340  NA 139  --  136 138 139  K 3.6  --  3.7 3.6 3.7  CL 113*  --  102 105 104  CO2 20*  --  22 22 23   GLUCOSE 126*  --  100* 97 121*  BUN 18  --  20 15 19   CREATININE 1.13 1.13 1.33* 1.15 1.20  CALCIUM 9.0  --  9.2 9.1 9.1  MG 2.1  --  1.9 2.0 1.9   GFR: Estimated Creatinine Clearance: 71 mL/min (by C-G formula based on SCr of 1.2 mg/dL). Liver Function Tests: No results for input(s): AST, ALT, ALKPHOS, BILITOT, PROT, ALBUMIN in the last 168 hours. No results for input(s): LIPASE, AMYLASE in the last 168 hours. No results for input(s): AMMONIA in the last 168 hours. Coagulation Profile: Recent Labs  Lab  03/03/18 0546  INR 1.07   Cardiac Enzymes: Recent Labs  Lab 03/03/18 0546 03/03/18 1218 03/03/18 1805 03/03/18 2326  TROPONINI 0.03* <0.03 <0.03 <0.03   BNP (last 3 results) No results for input(s): PROBNP in the last 8760 hours. HbA1C: No results for input(s): HGBA1C in the last 72 hours. CBG: No results for input(s): GLUCAP in the last 168 hours. Lipid Profile: No results for input(s): CHOL, HDL, LDLCALC, TRIG, CHOLHDL, LDLDIRECT in the last 72 hours. Thyroid Function Tests: No results for input(s): TSH, T4TOTAL, FREET4, T3FREE, THYROIDAB in the last 72 hours. Anemia Panel: No results for input(s): VITAMINB12, FOLATE, FERRITIN, TIBC, IRON, RETICCTPCT in the last 72 hours. Sepsis Labs: No results for input(s): PROCALCITON, LATICACIDVEN in the last 168 hours.  No results found for this or any previous visit (from the past 240 hour(s)).       Radiology Studies: No results found.      Scheduled Meds: . carvedilol  3.125 mg Oral BID WC  . enoxaparin (LOVENOX) injection  40 mg Subcutaneous Q24H  . furosemide  20 mg Oral BID  . nicotine  14 mg Transdermal Daily  . sodium chloride flush  3 mL Intravenous Q12H  . sodium chloride flush  3 mL Intravenous Q12H  . spironolactone  12.5 mg Oral Daily   Continuous Infusions: . sodium chloride    . sodium chloride       LOS: 3 days     Alwyn Ren, MD [facility]  If 7PM-7AM, please contact night-coverage [email or web address] Password [email or web address] 03/06/2018, 2:20 PM

## 2018-03-06 NOTE — Progress Notes (Addendum)
Advanced Heart Failure Rounding Note  PCP-Cardiologist: Martin Batty, MD   Subjective:    [facility] charted yesterday (only got 20 mg lasix PO x 1). Low filling pressures on cath yesterday, but weight up 2 lbs this am. SBP 120-130s  Denies CP, SOB, orthopnea, or dizziness. Only ambulating in room. Wants to know when he can go home.   Echo 03/04/18: EF 30-35%, diffuse HK, severe MR, LA severely dilated, RA mod dilated, RV mildly down  [facility] 03/05/18 Ao = 108/68 (84)  LV = 110/15 RA = 2 RV = 39/4 PA = 31/10 (20) PCW = 8 (no significant v-waves) Fick cardiac output/index = 5.0/2.7 SVR = 1307 PVR =  2.4 WU Ao sat = 97% PA sat = 68%, 70%  Assessment: 1. Normal coronaries 2. Severe NICM EF 20-25% 3. 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 Well compensated filling pressures  Plan/Discussion: Will need TEE to further evaluate etiology of MR.    Objective:   Weight Range: 71.3 kg Body mass index is 22.54 kg/m.   Vital Signs:   Temp:  [97.8 F (36.6 C)-98.1 F (36.7 C)] 98.1 F (36.7 C) (12/04 0619) Pulse Rate:  [48-65] 61 (12/04 0856) Resp:  [8-20] 18 (12/04 0856) BP: (112-132)/(71-95) 132/85 (12/04 0856) SpO2:  [88 %-100 %] 100 % (12/04 0856) Weight:  [71.3 kg] 71.3 kg (12/04 0619) Last BM Date: 03/06/18  Weight change: Filed Weights   03/04/18 0539 03/05/18 0510 03/06/18 0619  Weight: 70.1 kg 70.6 kg 71.3 kg    Intake/Output:   Intake/Output Summary (Last 24 hours) at 03/06/2018 1122 Last data filed at 03/06/2018 0917 Gross per 24 hour  Intake 720 ml  Output 500 ml  Net 220 ml      Physical Exam    General:  Well appearing. No resp difficulty HEENT: Normal x poor dentation Neck: Supple. JVP ~8. Carotids 2+ bilat; no bruits. No lymphadenopathy or thyromegaly appreciated. Cor: PMI nondisplaced. Regular rate & rhythm. 3/6 MR Lungs: Clear Abdomen: Soft, nontender, nondistended. No hepatosplenomegaly. No bruits or masses. Good bowel sounds. Extremities: No  cyanosis, clubbing, rash, edema Neuro: Alert & orientedx3, cranial nerves grossly intact. moves all 4 extremities w/o difficulty. Affect pleasant   Telemetry   Sinus brady/NSR 50-60s. Personally reviewed.   EKG    No new tracings.  Labs    CBC Recent Labs    03/03/18 1218  WBC 7.0  HGB 13.0  HCT 40.4  MCV 85.2  PLT 228   Basic Metabolic Panel Recent Labs    97/58/83 0501 03/06/18 0340  NA 138 139  K 3.6 3.7  CL 105 104  CO2 22 23  GLUCOSE 97 121*  BUN 15 19  CREATININE 1.15 1.20  CALCIUM 9.1 9.1  MG 2.0 1.9   Liver Function Tests No results for input(s): AST, ALT, ALKPHOS, BILITOT, PROT, ALBUMIN in the last 72 hours. No results for input(s): LIPASE, AMYLASE in the last 72 hours. Cardiac Enzymes Recent Labs    03/03/18 1218 03/03/18 1805 03/03/18 2326  TROPONINI <0.03 <0.03 <0.03    BNP: BNP (last 3 results) Recent Labs    03/03/18 0546  BNP 702.3*    ProBNP (last 3 results) No results for input(s): PROBNP in the last 8760 hours.   D-Dimer No results for input(s): DDIMER in the last 72 hours. Hemoglobin A1C Recent Labs    03/03/18 1218  HGBA1C 5.8*   Fasting Lipid Panel Recent Labs    03/03/18 1218  CHOL 169  HDL 33*  LDLCALC 118*  TRIG 91  CHOLHDL 5.1   Thyroid Function Tests Recent Labs    03/03/18 1218  TSH 2.811    Other results:   Imaging     No results found.   Medications:     Scheduled Medications: . carvedilol  3.125 mg Oral BID WC  . enoxaparin (LOVENOX) injection  40 mg Subcutaneous Q24H  . furosemide  20 mg Oral BID  . nicotine  14 mg Transdermal Daily  . sodium chloride flush  3 mL Intravenous Q12H  . sodium chloride flush  3 mL Intravenous Q12H     Infusions: . sodium chloride    . sodium chloride       PRN Medications:  sodium chloride, sodium chloride, acetaminophen, sodium chloride flush, sodium chloride flush    Patient Profile   Martin Keller a 52 y.o.malewith a hx of  untreated HTN and tobacco use.   Presented to [facility] 12[facility] with SOB and found to have newly reduced EF 30-35% and severe MR.  Assessment/Plan   1. A/C systolic HF due to NICM. LHC normal coronaries 12/3 - Echo 12/2: EF 30-35%, diffuse HK, severe MR, LA severely dilated, RA mod dilated, RV mildly down - Volume stable on exam. - Continue lasix 20 mg PO BID - Continue coreg 3.125 mg BID - Add spiro 12.5 mg daily.  - Several questions about salt restriction. We discussed. Will add RD consult for more education.  2. Severe MR - Severe by echo 12/2 and on cath yesterday - Will need TEE to further evaluate. Scheduled for 8 am with Dr Shirlee Latch tomorrow. Discussed procedure with patient.   3. HTN - SBP 120-130s. As above, add spiro.  4. Tobacco use - Continue nicotine patch. Encouraged cessation.   Medication concerns reviewed with patient and pharmacy team. Barriers identified: none  Length of Stay: 3  Martin Highland, NP  03/06/2018, 11:22 AM  Advanced Heart Failure Team Pager [phone] (M-F; 7a - 4p)  Please contact [facility] for night-coverage after hours (4p -7a ) and weekends on [email or web address]  Patient seen and examined with the above-signed Advanced Practice Provider and/or Housestaff. I personally reviewed laboratory data, imaging studies and relevant notes. I independently examined the patient and formulated the important aspects of the plan. I have edited the note to reflect any of my changes or salient points. I have personally discussed the plan with the patient and/or family.  Echo and cath films reviewed. He has severe LV dysfunction with EF 20% and dilated LV  (LVIDd = 6.6 cm) with severe central MR which is likely secondary in nature. Etiology of CM unclear but may be related to HTN or ETOH (drinks liquor heavily on weekends). Will continue to titrate HF meds. Needs more diuresis today. Give lasix 40 iv and change po lasix to 40 daily. Plan TEE tomorrow to evaluate MV more  closely. I am worried compliance may be an issue.   Arvilla Meres, MD  5:14 PM

## 2018-03-06 NOTE — Plan of Care (Signed)
Nutrition Education Note RD consulted for nutrition education regarding new onset CHF.  Case discussed with RN, who reports pt with poor insight related to medical condition.  Spoke with pt and girlfriend at bedside. Pt was hard to engage at time of visit; he made minimal eye contact with this RD and would respond mainly to close-ended questions. When asked basic questions regarding hospitalization, pt reported that he didn't know, however, girlfriend was able to provide appropriate answers to questions. Discussed with pt importance of self-management to prevent further complications and future hospitalizations.   Pt girlfriend reports pt has a ravenous appetite and consumes a lot of sodium and fast food ("it's common for him to eat $45 worth of fast food for one meal"). Focus on education was choosing lower sodium fast food/convenience foods and ways to overall decrease sodium in diet. Girlfriend was engaged in conversation and asked appropriate questions. She reports she is willing to prepare food at home more often to help decrease sodium intake in diet.   RD provided "Low Sodium Nutrition Therapy" and "Sodium Free Flavoring Tips" handouts from the Academy of [employer]. Reviewed patient's dietary recall. Provided examples on ways to decrease sodium intake in diet. Discouraged intake of processed foods and use of salt shaker. Encouraged fresh fruits and vegetables as well as whole grain sources of carbohydrates to maximize fiber intake.   RD discussed why it is important for patient to adhere to diet recommendations, and emphasized the role of fluids, foods to avoid, and importance of weighing self daily. Teach back method used.  Expect fair to poor compliance.  Body mass index is 22.54 kg/m. Pt meets criteria for normal weight range based on current BMI.  Current diet order is Heart Healthy, patient is consuming approximately 100% of meals at this time. Labs and medications  reviewed. No further nutrition interventions warranted at this time. RD contact information provided. If additional nutrition issues arise, please re-consult RD.   Nykeria Mealing A. Mayford Knife, RD, LDN, CDE Pager: [phone] After hours Pager: [phone]

## 2018-03-06 NOTE — Progress Notes (Signed)
Pt stated he felt chest discomfort at shift change.  Chest pain protocol initiated.  VSS. 12 lead EKG completed. Pt states that CP is gone. Page sent to Cardiology on call who stated given results of cath not to worry.

## 2018-03-07 ENCOUNTER — Inpatient Hospital Stay (HOSPITAL_COMMUNITY): Payer: Self-pay

## 2018-03-07 ENCOUNTER — Encounter (HOSPITAL_COMMUNITY): Payer: Self-pay | Admitting: *Deleted

## 2018-03-07 ENCOUNTER — Encounter (HOSPITAL_COMMUNITY)
Admission: EM | Disposition: A | Discharge status: Home / Self Care | Payer: Self-pay | Source: Home / Self Care | Attending: Student

## 2018-03-07 DIAGNOSIS — I340 Nonrheumatic mitral (valve) insufficiency: Secondary | ICD-10-CM

## 2018-03-07 HISTORY — PX: TEE WITHOUT CARDIOVERSION: SHX5443

## 2018-03-07 LAB — BASIC METABOLIC PANEL
Anion gap: 12 (ref 5–15)
BUN: 19 mg/dL (ref 6–20)
CO2: 23 mmol/L (ref 22–32)
Calcium: 9.3 mg/dL (ref 8.9–10.3)
Chloride: 103 mmol/L (ref 98–111)
Creatinine, Ser: 1.13 mg/dL (ref 0.61–1.24)
GFR calc Af Amer: >60 mL/min (ref >60)
GFR calc non Af Amer: >60 mL/min (ref >60)
Glucose, Bld: 103 mg/dL — ABNORMAL HIGH (ref 70–99)
Potassium: 4 mmol/L (ref 3.5–5.1)
Sodium: 138 mmol/L (ref 135–145)

## 2018-03-07 LAB — MAGNESIUM: Magnesium: 2.2 mg/dL (ref 1.7–2.4)

## 2018-03-07 SURGERY — ECHOCARDIOGRAM, TRANSESOPHAGEAL
Anesthesia: Moderate Sedation

## 2018-03-07 MED ORDER — MIDAZOLAM HCL (PF) 10 MG/2ML IJ SOLN
INTRAMUSCULAR | Status: DC | PRN
  Administered 2018-03-07: 2 mg via INTRAVENOUS
  Administered 2018-03-07: 1 mg via INTRAVENOUS

## 2018-03-07 MED ORDER — BUTAMBEN-TETRACAINE-BENZOCAINE 2-2-14 % EX AERO
INHALATION_SPRAY | CUTANEOUS | Status: DC | PRN
  Administered 2018-03-07: 2 via TOPICAL

## 2018-03-07 MED ORDER — FENTANYL CITRATE (PF) 100 MCG/2ML IJ SOLN
INTRAMUSCULAR | Status: DC | PRN
  Administered 2018-03-07 (×2): 25 ug via INTRAVENOUS

## 2018-03-07 MED ORDER — SPIRONOLACTONE 25 MG PO TABS: 12.5000 mg | ORAL_TABLET | Freq: Every day | ORAL | 1 refills | Status: DC

## 2018-03-07 MED ORDER — LOSARTAN POTASSIUM 25 MG PO TABS: 25.0000 mg | ORAL_TABLET | Freq: Every day | ORAL | 1 refills | Status: DC

## 2018-03-07 MED ORDER — SODIUM CHLORIDE 0.9 % IV SOLN
INTRAVENOUS | Status: DC
  Administered 2018-03-07: 06:00:00 via INTRAVENOUS

## 2018-03-07 MED ORDER — FENTANYL CITRATE (PF) 100 MCG/2ML IJ SOLN
INTRAMUSCULAR | Status: AC
  Filled 2018-03-07: qty 2

## 2018-03-07 MED ORDER — NICOTINE 14 MG/24HR TD PT24: 14.0000 mg | MEDICATED_PATCH | Freq: Every day | TRANSDERMAL | 0 refills | Status: DC

## 2018-03-07 MED ORDER — FUROSEMIDE 40 MG PO TABS: 40.0000 mg | ORAL_TABLET | Freq: Every day | ORAL | 1 refills | Status: DC

## 2018-03-07 MED ORDER — MIDAZOLAM HCL (PF) 5 MG/ML IJ SOLN
INTRAMUSCULAR | Status: AC
  Filled 2018-03-07: qty 2

## 2018-03-07 MED ORDER — HYDROCORTISONE 1 % EX CREA
TOPICAL_CREAM | CUTANEOUS | Status: DC | PRN
  Filled 2018-03-07: qty 28

## 2018-03-07 MED ORDER — CARVEDILOL 3.125 MG PO TABS: 3.1250 mg | ORAL_TABLET | Freq: Two times a day (BID) | ORAL | 0 refills | Status: DC

## 2018-03-07 MED FILL — SPIRONOLACTONE 25 MG TABLET: 25 | 34 days supply | Qty: 17 | Fill #0

## 2018-03-07 MED FILL — LOSARTAN POTASSIUM 25 MG TA: 25 | 34 days supply | Qty: 34 | Fill #0

## 2018-03-07 MED FILL — FUROSEMIDE 40 MG TAB: 40 | 100 days supply | Qty: 100 | Fill #0

## 2018-03-07 MED FILL — CARVEDILOL 3.125 MG TABLET: 3.125 | 34 days supply | Qty: 68 | Fill #0

## 2018-03-07 NOTE — Progress Notes (Signed)
  Advanced Heart Failure Rounding Note  PCP-Cardiologist: Jonathan Berry, MD   Subjective:    Got IV lasix yesterday with good output. Lying flat in bed. No SOB or orthopnea. Wants to go home   Echo 03/04/18: EF 30-35%, diffuse HK, severe MR, LA severely dilated, RA mod dilated, RV mildly down  R/LHC 03/05/18 Ao = 108/68 (84)  LV = 110/15 RA = 2 RV = 39/4 PA = 31/10 (20) PCW = 8 (no significant v-waves) Fick cardiac output/index = 5.0/2.7 SVR = 1307 PVR =  2.4 WU Ao sat = 97% PA sat = 68%, 70%  Assessment: 1. Normal coronaries 2. Severe NICM EF 20-25% 3. 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 Well compensated filling pressures  Plan/Discussion: Will need TEE to further evaluate etiology of MR.    Objective:   Weight Range: 71.3 kg Body mass index is 22.54 kg/m.   Vital Signs:   Temp:  [97.7 F (36.5 C)-98.3 F (36.8 C)] 98.3 F (36.8 C) (12/04 1944) Pulse Rate:  [61-63] 61 (12/04 1944) Resp:  [18-20] 20 (12/04 1944) BP: (120-140)/(77-92) 130/77 (12/04 1944) SpO2:  [96 %-100 %] 96 % (12/04 1944) Weight:  [71.3 kg] 71.3 kg (12/04 0619) Last BM Date: 03/06/18  Weight change: Filed Weights   03/04/18 0539 03/05/18 0510 03/06/18 0619  Weight: 70.1 kg 70.6 kg 71.3 kg    Intake/Output:   Intake/Output Summary (Last 24 hours) at 03/07/2018 0427 Last data filed at 03/07/2018 0200 Gross per 24 hour  Intake 1370 ml  Output 3400 ml  Net -2030 ml      Physical Exam    General:  Well appearing. No resp difficulty HEENT: normal Neck: supple. JVP 5-6. Carotids 2+ bilat; no bruits. No lymphadenopathy or thryomegaly appreciated. Cor: PMI nondisplaced. Regular rate & rhythm. 3/6 MR Lungs: clear Abdomen: soft, nontender, nondistended. No hepatosplenomegaly. No bruits or masses. Good bowel sounds. Extremities: no cyanosis, clubbing, rash, edema Neuro: alert & orientedx3, cranial nerves grossly intact. moves all 4 extremities w/o difficulty. Affect pleasant   Telemetry    Sinus 60s. Personally reviewed.   EKG    No new tracings.  Labs    CBC No results for input(s): WBC, NEUTROABS, HGB, HCT, MCV, PLT in the last 72 hours. Basic Metabolic Panel Recent Labs    03/05/18 0501 03/06/18 0340  NA 138 139  K 3.6 3.7  CL 105 104  CO2 22 23  GLUCOSE 97 121*  BUN 15 19  CREATININE 1.15 1.20  CALCIUM 9.1 9.1  MG 2.0 1.9   Liver Function Tests No results for input(s): AST, ALT, ALKPHOS, BILITOT, PROT, ALBUMIN in the last 72 hours. No results for input(s): LIPASE, AMYLASE in the last 72 hours. Cardiac Enzymes No results for input(s): CKTOTAL, CKMB, CKMBINDEX, TROPONINI in the last 72 hours.  BNP: BNP (last 3 results) Recent Labs    03/03/18 0546  BNP 702.3*    ProBNP (last 3 results) No results for input(s): PROBNP in the last 8760 hours.   D-Dimer No results for input(s): DDIMER in the last 72 hours. Hemoglobin A1C No results for input(s): HGBA1C in the last 72 hours. Fasting Lipid Panel No results for input(s): CHOL, HDL, LDLCALC, TRIG, CHOLHDL, LDLDIRECT in the last 72 hours. Thyroid Function Tests No results for input(s): TSH, T4TOTAL, T3FREE, THYROIDAB in the last 72 hours.  Invalid input(s): FREET3  Other results:   Imaging    No results found.   Medications:     Scheduled Medications: . carvedilol    3.125 mg Oral BID WC  . enoxaparin (LOVENOX) injection  40 mg Subcutaneous Q24H  . furosemide  40 mg Oral Daily  . losartan  25 mg Oral Daily  . nicotine  14 mg Transdermal Daily  . sodium chloride flush  3 mL Intravenous Q12H  . sodium chloride flush  3 mL Intravenous Q12H  . spironolactone  12.5 mg Oral Daily    Infusions: . sodium chloride    . sodium chloride    . sodium chloride      PRN Medications: sodium chloride, sodium chloride, acetaminophen, hydrocortisone cream, sodium chloride flush, sodium chloride flush    Patient Profile   Martin Kelleris a 52 y.o.malewith a hx of untreated HTN and  tobacco use.   Presented to [facility] on 12/1 with SOB and found to have newly reduced EF 30-35% and severe MR.  Assessment/Plan   1. A/C systolic HF due to NICM. LHC normal coronaries 12/3 - Echo 12/2: EF 30-35%, diffuse HK, severe MR, LA severely dilated, RA mod dilated, RV mildly down - Volume stable improved on po lasix - Continue lasix 40 mg PO  daily - Continue coreg 3.125 mg BID - Continue spiro 12.5 mg daily.  - Continue losartan 25 daily  -Echo and cath films reviewed. He has severe LV dysfunction with EF 20% and dilated LV  (LVIDd = 6.6 cm) with severe central MR which is likely secondary in nature. Etiology of CM unclear but may be related to HTN or ETOH (drinks liquor heavily on weekends).  Plan TEE tomorrow to evaluate MV more closely - suspect this is secondary to LV dysfunction/dilation. Can likely go home after TEE. I am worried compliance may be an issue.   2. Severe MR - Severe by echo 12/2 and on cath yesterday - TEE today with Dr. Mclean   3. HTN - Blood pressure well controlled. Continue current regimen.  4. Tobacco use - Continue nicotine patch. Encouraged cessation.   Medication concerns reviewed with patient and pharmacy team. Barriers identified: none  Length of Stay: 4  Daniel Bensimhon, MD  03/07/2018, 4:27 AM  Advanced Heart Failure Team Pager [phone] (M-F; 7a - 4p)  Please contact [facility] for night-coverage after hours (4p -7a ) and weekends on [email or web address]     

## 2018-03-07 NOTE — Progress Notes (Signed)
The folllowing medications have been provided to the patient through the HF Fund at [facility] per Dr. Gala Romney.   Lasix 40 mg PO  daily Coreg 3.125 mg BID Spironlactone 12.5 mg daily.  Losartan 25 daily

## 2018-03-07 NOTE — Interval H&P Note (Signed)
History and Physical Interval Note:  03/07/2018 10:15 AM  Martin Keller  has presented today for surgery, with the diagnosis of severe mitral regurg  The various methods of treatment have been discussed with the patient and family. After consideration of risks, benefits and other options for treatment, the patient has consented to  Procedure(s): TRANSESOPHAGEAL ECHOCARDIOGRAM (TEE) (N/A) as a surgical intervention .  The patient's history has been reviewed, patient examined, no change in status, stable for surgery.  I have reviewed the patient's chart and labs.  Questions were answered to the patient's satisfaction.     Tanuj Mullens [employer]

## 2018-03-07 NOTE — Progress Notes (Signed)
Patient ready for discharge to home. All d/c instructions reviewed with patient. Personal belongings with pt. New meds and appointments reviewed with patient. Patient in no distress at time of discharge.

## 2018-03-07 NOTE — H&P (View-Only) (Signed)
Advanced Heart Failure Rounding Note  PCP-Cardiologist: Nanetta Batty, MD   Subjective:    Got IV lasix yesterday with good output. Lying flat in bed. No SOB or orthopnea. Wants to go home   Echo 03/04/18: EF 30-35%, diffuse HK, severe MR, LA severely dilated, RA mod dilated, RV mildly down  [facility] 03/05/18 Ao = 108/68 (84)  LV = 110/15 RA = 2 RV = 39/4 PA = 31/10 (20) PCW = 8 (no significant v-waves) Fick cardiac output/index = 5.0/2.7 SVR = 1307 PVR =  2.4 WU Ao sat = 97% PA sat = 68%, 70%  Assessment: 1. Normal coronaries 2. Severe NICM EF 20-25% 3. 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 Well compensated filling pressures  Plan/Discussion: Will need TEE to further evaluate etiology of MR.    Objective:   Weight Range: 71.3 kg Body mass index is 22.54 kg/m.   Vital Signs:   Temp:  [97.7 F (36.5 C)-98.3 F (36.8 C)] 98.3 F (36.8 C) (12/04 1944) Pulse Rate:  [61-63] 61 (12/04 1944) Resp:  [18-20] 20 (12/04 1944) BP: (120-140)/(77-92) 130/77 (12/04 1944) SpO2:  [96 %-100 %] 96 % (12/04 1944) Weight:  [71.3 kg] 71.3 kg (12/04 0619) Last BM Date: 03/06/18  Weight change: Filed Weights   03/04/18 0539 03/05/18 0510 03/06/18 0619  Weight: 70.1 kg 70.6 kg 71.3 kg    Intake/Output:   Intake/Output Summary (Last 24 hours) at 03/07/2018 0427 Last data filed at 03/07/2018 0200 Gross per 24 hour  Intake 1370 ml  Output 3400 ml  Net -2030 ml      Physical Exam    General:  Well appearing. No resp difficulty HEENT: normal Neck: supple. JVP 5-6. Carotids 2+ bilat; no bruits. No lymphadenopathy or thryomegaly appreciated. Cor: PMI nondisplaced. Regular rate & rhythm. 3/6 MR Lungs: clear Abdomen: soft, nontender, nondistended. No hepatosplenomegaly. No bruits or masses. Good bowel sounds. Extremities: no cyanosis, clubbing, rash, edema Neuro: alert & orientedx3, cranial nerves grossly intact. moves all 4 extremities w/o difficulty. Affect pleasant   Telemetry    Sinus 60s. Personally reviewed.   EKG    No new tracings.  Labs    CBC No results for input(s): WBC, NEUTROABS, HGB, HCT, MCV, PLT in the last 72 hours. Basic Metabolic Panel Recent Labs    38/46/65 0501 03/06/18 0340  NA 138 139  K 3.6 3.7  CL 105 104  CO2 22 23  GLUCOSE 97 121*  BUN 15 19  CREATININE 1.15 1.20  CALCIUM 9.1 9.1  MG 2.0 1.9   Liver Function Tests No results for input(s): AST, ALT, ALKPHOS, BILITOT, PROT, ALBUMIN in the last 72 hours. No results for input(s): LIPASE, AMYLASE in the last 72 hours. Cardiac Enzymes No results for input(s): CKTOTAL, CKMB, CKMBINDEX, TROPONINI in the last 72 hours.  BNP: BNP (last 3 results) Recent Labs    03/03/18 0546  BNP 702.3*    ProBNP (last 3 results) No results for input(s): PROBNP in the last 8760 hours.   D-Dimer No results for input(s): DDIMER in the last 72 hours. Hemoglobin A1C No results for input(s): HGBA1C in the last 72 hours. Fasting Lipid Panel No results for input(s): CHOL, HDL, LDLCALC, TRIG, CHOLHDL, LDLDIRECT in the last 72 hours. Thyroid Function Tests No results for input(s): TSH, T4TOTAL, T3FREE, THYROIDAB in the last 72 hours.  Invalid input(s): FREET3  Other results:   Imaging    No results found.   Medications:     Scheduled Medications: . carvedilol  3.125 mg Oral BID WC  . enoxaparin (LOVENOX) injection  40 mg Subcutaneous Q24H  . furosemide  40 mg Oral Daily  . losartan  25 mg Oral Daily  . nicotine  14 mg Transdermal Daily  . sodium chloride flush  3 mL Intravenous Q12H  . sodium chloride flush  3 mL Intravenous Q12H  . spironolactone  12.5 mg Oral Daily    Infusions: . sodium chloride    . sodium chloride    . sodium chloride      PRN Medications: sodium chloride, sodium chloride, acetaminophen, hydrocortisone cream, sodium chloride flush, sodium chloride flush    Patient Profile   Martin Keller a 52 y.o.malewith a hx of untreated HTN and  tobacco use.   Presented to [facility] on 12/1 with SOB and found to have newly reduced EF 30-35% and severe MR.  Assessment/Plan   1. A/C systolic HF due to NICM. LHC normal coronaries 12/3 - Echo 12/2: EF 30-35%, diffuse HK, severe MR, LA severely dilated, RA mod dilated, RV mildly down - Volume stable improved on po lasix - Continue lasix 40 mg PO  daily - Continue coreg 3.125 mg BID - Continue spiro 12.5 mg daily.  - Continue losartan 25 daily  -Echo and cath films reviewed. He has severe LV dysfunction with EF 20% and dilated LV  (LVIDd = 6.6 cm) with severe central MR which is likely secondary in nature. Etiology of CM unclear but may be related to HTN or ETOH (drinks liquor heavily on weekends).  Plan TEE tomorrow to evaluate MV more closely - suspect this is secondary to LV dysfunction/dilation. Can likely go home after TEE. I am worried compliance may be an issue.   2. Severe MR - Severe by echo 12/2 and on cath yesterday - TEE today with Dr. Shirlee Latch   3. HTN - Blood pressure well controlled. Continue current regimen.  4. Tobacco use - Continue nicotine patch. Encouraged cessation.   Medication concerns reviewed with patient and pharmacy team. Barriers identified: none  Length of Stay: 4  Arvilla Meres, MD  03/07/2018, 4:27 AM  Advanced Heart Failure Team Pager [phone] (M-F; 7a - 4p)  Please contact [facility] for night-coverage after hours (4p -7a ) and weekends on [email or web address]

## 2018-03-07 NOTE — Plan of Care (Signed)

## 2018-03-07 NOTE — Discharge Summary (Signed)
Physician Discharge Summary  Martin Keller ZOX:[id number] DOB: 1965/12/25 DOA: 03/03/2018  PCP: Patient, No Pcp Per  Admit date: 03/03/2018 Discharge date: 03/07/2018  Admitted From:home Disposition: home Recommendations for Outpatient Follow-up:  1. Follow up with PCP in 1-2 weeks 2. Please obtain BMP/CBC in one week 3. Please follow up with heart failure clinic  Home Health:none Equipment/Devices:none Discharge Condition:stable CODE STATUS:full Diet recommendation: cardiac Brief/Interim Summary:52 y.o.malewith history ofhypertension not on any medication,heart murmur andtobacco use who was admitted with dyspnea and orthopnea due to new CHF. Chest x-ray with pulmonary congestion. BNP elevated to 700. Started on IV Lasix 20 mg twice daily with good urine output and improvement in his symptoms but mild creatinine uptrend. Transitioned to oral Lasix 20 mg twice daily on 03/04/2018.   Discharge Diagnoses:  Active Problems:   CHF (congestive heart failure) (HCC)   New onset of congestive heart failure (HCC)   Essential hypertension   Elevated troponin   Prolonged QT interval  #1 new onset systolic CHF with ejection fraction 30 to 35% with diffuse hypokinesis and severe MR severely dilated left atrium with more dilated right atrium.  Cardiac cardiac cath  shows normal coronaries and nonischemic cardiomyopathy with ejection fraction 20 to 25%.  He also has 3-4+ severe MR.  Patient had a TEE done 03/07/2018 to evaluate mitral valve-it showed severe functional mitral regurgitation.    #2 hypertension currently stable on medications.  At the time of admission his blood pressure was one 158/114 and he was not taking any medications prior to admission to the hospital.  Patient will be discharged on Lasix, Aldactone, beta-blocker and ACE inhibitor.   Estimated body mass index is 22.31 kg/m as calculated from the following:   Height as of this encounter: 5\' 10"  (1.778 m).   Weight as of  this encounter: 70.5 kg.  Discharge Instructions  Discharge Instructions    Call MD for:  difficulty breathing, headache or visual disturbances   Complete by:  As directed    Call MD for:  persistant dizziness or light-headedness   Complete by:  As directed    Diet - low sodium heart healthy   Complete by:  As directed    Heart Failure patients record your daily weight using the same scale at the same time of day   Complete by:  As directed    Increase activity slowly   Complete by:  As directed    STOP any activity that causes chest pain, shortness of breath, dizziness, sweating, or exessive weakness   Complete by:  As directed      Allergies as of 03/07/2018   No Known Allergies     Medication List    TAKE these medications   carvedilol 3.125 MG tablet Commonly known as:  COREG Take 1 tablet (3.125 mg total) by mouth 2 (two) times daily with a meal.   furosemide 40 MG tablet Commonly known as:  LASIX Take 1 tablet (40 mg total) by mouth daily.   losartan 25 MG tablet Commonly known as:  COZAAR Take 1 tablet (25 mg total) by mouth daily.   nicotine 14 mg/24hr patch Commonly known as:  NICODERM CQ - dosed in mg/24 hours Place 1 patch (14 mg total) onto the skin daily.   spironolactone 25 MG tablet Commonly known as:  ALDACTONE Take 0.5 tablets (12.5 mg total) by mouth daily.      Follow-up Information    [facility]. Go on 03/22/2018.  Specialty:  Cardiology Why:  at 11:30 AM in the Advanced Heart Failure Clinic.  Please bring all medications to appt.  gate code is 1900 for December. Contact information: [address] [phone] mc [place] [address] [phone]         No Known Allergies   Consultations:cardiology   Procedures/Studies: Dg Chest 2 View  Result Date: 03/03/2018 CLINICAL DATA:  Initial evaluation for acute shortness of breath, history of CHF. EXAM: CHEST - 2 VIEW  COMPARISON:  None. FINDINGS: Cardiomegaly. Mediastinal silhouette within normal limits. Lungs normally inflated. Diffuse vascular congestion with interstitial prominence in scattered Kerley B-lines, compatible with pulmonary interstitial edema. No focal infiltrates. No significant pleural effusion. No pneumothorax. No acute osseus abnormality. IMPRESSION: Cardiomegaly with mild diffuse pulmonary interstitial edema, compatible with acute CHF exacerbation. Electronically Signed   By: Rise Mu M.D.   On: 03/03/2018 06:30    (Echo, Carotid, EGD, Colonoscopy, ERCP)    Subjective: Feels well resting in bed laying flat denies shortness of breath or chest pain not on oxygen   Discharge Exam: Vitals:   03/07/18 1050 03/07/18 1055  BP: 116/77 (!) 114/57  Pulse: (!) 57 (!) 57  Resp: (!) 23 (!) 26  Temp:    SpO2: 95% 92%   Vitals:   03/07/18 1040 03/07/18 1049 03/07/18 1050 03/07/18 1055  BP: 118/76 116/77 116/77 (!) 114/57  Pulse: (!) 56 (!) 57 (!) 57 (!) 57  Resp: (!) 22 (!) 21 (!) 23 (!) 26  Temp:  97.6 F (36.4 C)    TempSrc:  Oral    SpO2: (!) 89% 92% 95% 92%  Weight:      Height:        General: Pt is alert, awake, not in acute distress Cardiovascular: RRR, S1/S2 +, no rubs, no gallops Respiratory: CTA bilaterally, no wheezing, no rhonchi Abdominal: Soft, NT, ND, bowel sounds + Extremities: no edema, no cyanosis    The results of significant diagnostics from this hospitalization (including imaging, microbiology, ancillary and laboratory) are listed below for reference.     Microbiology: No results found for this or any previous visit (from the past 240 hour(s)).   Labs: BNP (last 3 results) Recent Labs    03/03/18 0546  BNP 702.3*   Basic Metabolic Panel: Recent Labs  Lab 03/03/18 0546 03/03/18 1218 03/04/18 0002 03/05/18 0501 03/06/18 0340 03/07/18 0423  NA 139  --  136 138 139 138  K 3.6  --  3.7 3.6 3.7 4.0  CL 113*  --  102 105 104 103  CO2  20*  --  22 22 23 23   GLUCOSE 126*  --  100* 97 121* 103*  BUN 18  --  20 15 19 19   CREATININE 1.13 1.13 1.33* 1.15 1.20 1.13  CALCIUM 9.0  --  9.2 9.1 9.1 9.3  MG 2.1  --  1.9 2.0 1.9 2.2   Liver Function Tests: No results for input(s): AST, ALT, ALKPHOS, BILITOT, PROT, ALBUMIN in the last 168 hours. No results for input(s): LIPASE, AMYLASE in the last 168 hours. No results for input(s): AMMONIA in the last 168 hours. CBC: Recent Labs  Lab 03/03/18 0546 03/03/18 1218  WBC 6.6 7.0  HGB 13.0 13.0  HCT 41.5 40.4  MCV 87.7 85.2  PLT 233 228   Cardiac Enzymes: Recent Labs  Lab 03/03/18 0546 03/03/18 1218 03/03/18 1805 03/03/18 2326  TROPONINI 0.03* <0.03 <0.03 <0.03   BNP: Invalid input(s): POCBNP CBG: No results for  input(s): GLUCAP in the last 168 hours. D-Dimer No results for input(s): DDIMER in the last 72 hours. Hgb A1c No results for input(s): HGBA1C in the last 72 hours. Lipid Profile No results for input(s): CHOL, HDL, LDLCALC, TRIG, CHOLHDL, LDLDIRECT in the last 72 hours. Thyroid function studies No results for input(s): TSH, T4TOTAL, T3FREE, THYROIDAB in the last 72 hours.  Invalid input(s): FREET3 Anemia work up No results for input(s): VITAMINB12, FOLATE, FERRITIN, TIBC, IRON, RETICCTPCT in the last 72 hours. Urinalysis No results found for: COLORURINE, APPEARANCEUR, LABSPEC, PHURINE, GLUCOSEU, HGBUR, BILIRUBINUR, KETONESUR, PROTEINUR, UROBILINOGEN, NITRITE, LEUKOCYTESUR Sepsis Labs Invalid input(s): PROCALCITONIN,  WBC,  LACTICIDVEN Microbiology No results found for this or any previous visit (from the past 240 hour(s)).   Time coordinating discharge: 33  minutes  SIGNED:   Alwyn Ren, MD  [facility] 03/07/2018, 11:35 AM Pager   If 7PM-7AM, please contact night-coverage [email or web address] Password [email or web address]

## 2018-03-07 NOTE — CV Procedure (Signed)
Procedure: TEE  Sedation: Versed 3 mg, Fentanyl 50 mcg  Indication: Mitral regurgitation  Findings: Please see echo section for full report. Moderately dilated LV (LVEDD 6.8 cm) with normal wall thickness.  EF 25%, diffuse hypokinesis.  No LV thrombus noted.  Normal RV size with mildly decreased systolic function.  Moderate left atrial enlargement with no LA appendage thrombus.  Normal right atrium.  No ASD or PFO by color doppler. Trileaflet aortic valve with no significant regurgitation or stenosis.  Trivial TR.  The mitral valve appeared structurally normal but there was incomplete coaptation with restriction of the posterior leaflet.  There was severe central mitral regurgitation, ERO 0.6 cm^2 by PISA.  There was flattening of the pulmonary vein systolic doppler pattern but not frank reversal.  Normal caliber aorta with mild plaque.   Impression: Severe functional mitral regurgitation.   Martin Keller 03/07/2018 10:40 AM

## 2018-03-07 NOTE — Progress Notes (Signed)
Pt provided a scale and instructions on when to call HF clinic. Heart failure medications provided through HF fund. He was provided with a work excuse for 3-4 weeks out of work per Dr Gala Romney.  Alford Highland, NP

## 2018-03-21 NOTE — Progress Notes (Signed)
Advanced [facility] Failure Clinic Note   PCP: Patient, No Pcp Per PCP-Cardiologist: Quay Burow, MD  HF: Dr Haroldine Laws  HPI: Martin Keller is a 52 y.o. malewith a hx of HTN, tobacco use, systolic HF (diagnosed 02/5725), and severe MR (diagnosed 12/19). He does not have insurance.  He was admitted 12/1-12/5/19 with acute systolic HF. Echo showed EF 30-35% and severe MR. R/LHC completed with new systolic HF, which showed normal coronaries, severe NICM with EF 20-25%, and severe 3-4+ MR. TEE completed to further evaluate MR and showed severe functional MR. HF medications were optimized and provided through HF fund. He was given a scale. DC weight: 155 lbs  He returns today for post hospital follow up. He is doing fine since discharge. Denies SOB, orthopnea, PND, or edema. No dizziness. Able to take all medications, but he cannot afford the refills. Has no insurance currently and plans to apply for medicaid. He has an itchy rash on his back that occurs every other night. Not weighing at home. Eating a lot and adds garlic salt to some foods. Cooks most of his meals. Smoking 2 black and milds/day. Rare ETOH intake, but did drink heavily on Saturday for his brother's birthday. He does not have a PCP.  SH: Smokes 2 black and milds/day. no insurance, no problems with transportation, unable to afford medications. Rare ETOH use. 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   FH: mother with murmur, died of MI at 42 years, father died of MI in 50s.   Echo Mar 22, 2018: EF 30-35%, diffuse HK, severe MR, LA severely dilated, RA mod dilated, RV mildly down  [facility] 03/05/18 Ao = 108/68 (84)  LV = 110/15 RA = 2 RV = 39/4 PA = 31/10 (20) PCW = 8 (no significant v-waves) Fick cardiac output/index = 5.0/2.7 SVR = 1307 PVR = 2.4 WU Ao sat = 97% PA sat = 68%, 70%  Assessment: 1. Normal coronaries 2. Severe NICM EF 20-25% 3. 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 Well compensated filling  pressures  TEE 03/07/18:  EF 25%, diffuse HK, RV mildly decreased function, trivial TR, severe central mitral regurgitation, ERO 0.6 cm^2 by PISA.  Impression: severe functional mitral regurgitation  Review of systems complete and found to be negative unless listed in HPI.   Past Medical History:  Diagnosis Date  . Hypertension     Current Outpatient Medications  Medication Sig Dispense Refill  . carvedilol (COREG) 3.125 MG tablet Take 1 tablet (3.125 mg total) by mouth 2 (two) times daily with a meal. 60 tablet 0  . furosemide (LASIX) 40 MG tablet Take 1 tablet (40 mg total) by mouth daily. 30 tablet 1  . losartan (COZAAR) 25 MG tablet Take 1 tablet (25 mg total) by mouth daily. 30 tablet 1  . nicotine (NICODERM CQ - DOSED IN MG/24 HOURS) 14 mg/24hr patch Place 1 patch (14 mg total) onto the skin daily. 28 patch 0  . spironolactone (ALDACTONE) 25 MG tablet Take 0.5 tablets (12.5 mg total) by mouth daily. 30 tablet 1   No current facility-administered medications for this encounter.     No Known Allergies    Social History   Socioeconomic History  . Marital status: Divorced    Spouse name: Not on file  . Number of children: Not on file  . Years of education: Not on file  . Highest education level: Not on file  Occupational History  . Not on file  Social Needs  . Financial resource strain:  Not on file  . Food insecurity:    Worry: Not on file    Inability: Not on file  . Transportation needs:    Medical: Not on file    Non-medical: Not on file  Tobacco Use  . Smoking status: Current Every Day Smoker    Types: Cigars  . Smokeless tobacco: Never Used  Substance and Sexual Activity  . Alcohol use: Yes    Frequency: Never  . Drug use: Yes    Types: Marijuana  . Sexual activity: Yes    Partners: Female    Birth control/protection: None  Lifestyle  . Physical activity:    Days per week: Not on file    Minutes per session: Not on file  . Stress: Not on file   Relationships  . Social connections:    Talks on phone: Not on file    Gets together: Not on file    Attends religious service: Not on file    Active member of club or organization: Not on file    Attends meetings of clubs or organizations: Not on file    Relationship status: Not on file  . Intimate partner violence:    Fear of current or ex partner: Not on file    Emotionally abused: Not on file    Physically abused: Not on file    Forced sexual activity: Not on file  Other Topics Concern  . Not on file  Social History Narrative  . Not on file      Family History  Problem Relation Age of Onset  . Heart attack Mother        Died at age of 36 from heart attack.  Marland Kitchen Heart attack Father        Died at age of 56 from heart attack.  Marland Kitchen Heart disease Brother        Heart surgery at age of 49.  . Diabetes Child     Vitals:   03/22/18 1145  BP: 128/88  Pulse: 66  SpO2: 99%  Weight: 73.7 kg (162 lb 6.4 oz)   Wt Readings from Last 3 Encounters:  03/22/18 73.7 kg (162 lb 6.4 oz)  03/07/18 70.5 kg (155 lb 8 oz)  08/26/17 78 kg (172 lb)    PHYSICAL EXAM: General:  Well appearing. No respiratory difficulty HEENT: poor dentation Neck: supple. no JVD. Carotids 2+ bilat; no bruits. No lymphadenopathy or thyromegaly appreciated. Cor: PMI nondisplaced. Regular rate & rhythm. No rubs, gallops or murmurs. Lungs: clear Abdomen: soft, nontender, nondistended. No hepatosplenomegaly. No bruits or masses. Good bowel sounds. Extremities: no cyanosis, clubbing, rash, edema Neuro: alert & oriented x 3, cranial nerves grossly intact. moves all 4 extremities w/o difficulty. Affect pleasant.   ASSESSMENT & PLAN:  1. Chronic systolic HF due to NICM (new as of 03/2018). LHC normal coronaries 12/3. Etiology unclear - possibly HTN or ETOH.  - Echo 12/2: EF 30-35%, diffuse HK, severe MR, LA severely dilated, RA mod dilated, RV mildly down. TEE 12/5: EF 25%, severe functional MR - Volume stable  on exam - Continue lasix 40 mg daily. BMET today.  - Continue coreg 3.125 mg BID - Increase spiro to 25 mg daily  - Continue losartan 25 daily  - Repeat echo in 3-4 months. Has a narrow QRS so will not be CRT candidate.   2. Severe MR - TEE 03/07/18: EF 25%, diffuse HK, RV mildly decreased function, trivial TR, severe central mitral regurgitation, ERO 0.6 cm^2 by PISA.  Impression: severe functional mitral regurgitation.  - Repeat echo in 3-4 months  3. HTN - Stable.   4. Tobacco use - Smoking 2 black and milds/day.  5. ETOH use - Encouraged complete cessation  6. No insurance - CSW met with him today. Cannot do paramedicine because he lives in [place].   BMET today Increase spiro to 25 mg daily. BMET 7-10 days. He can get checked at [facility].  Follow up in 3-4 weeks with APP. Schedule him for echo in 3-4 months with Dr Haroldine Laws.  Georgiana Shore, NP 03/22/18  Greater than 50% of the 25 minute visit was spent in counseling/coordination of care regarding disease state education, salt/fluid restriction, sliding scale diuretics, and medication compliance.

## 2018-03-22 ENCOUNTER — Encounter (HOSPITAL_COMMUNITY): Payer: Self-pay

## 2018-03-22 ENCOUNTER — Ambulatory Visit (HOSPITAL_COMMUNITY)
Admission: RE | Admit: 2018-03-22 | Discharge: 2018-03-22 | Disposition: A | Discharge status: Home / Self Care | Payer: Self-pay | Source: Ambulatory Visit | Attending: Internal Medicine | Admitting: Internal Medicine

## 2018-03-22 VITALS — BP 128/88 | HR 66 | Wt 162.4 lb

## 2018-03-22 DIAGNOSIS — F17290 Nicotine dependence, other tobacco product, uncomplicated: Secondary | ICD-10-CM | POA: Insufficient documentation

## 2018-03-22 DIAGNOSIS — I5022 Chronic systolic (congestive) heart failure: Secondary | ICD-10-CM | POA: Insufficient documentation

## 2018-03-22 DIAGNOSIS — R21 Rash and other nonspecific skin eruption: Secondary | ICD-10-CM | POA: Insufficient documentation

## 2018-03-22 DIAGNOSIS — I110 Hypertensive heart disease with heart failure: Secondary | ICD-10-CM | POA: Insufficient documentation

## 2018-03-22 DIAGNOSIS — Z720 Tobacco use: Secondary | ICD-10-CM

## 2018-03-22 DIAGNOSIS — Z8249 Family history of ischemic heart disease and other diseases of the circulatory system: Secondary | ICD-10-CM | POA: Insufficient documentation

## 2018-03-22 DIAGNOSIS — I5043 Acute on chronic combined systolic (congestive) and diastolic (congestive) heart failure: Secondary | ICD-10-CM

## 2018-03-22 DIAGNOSIS — I428 Other cardiomyopathies: Secondary | ICD-10-CM | POA: Insufficient documentation

## 2018-03-22 DIAGNOSIS — I5042 Chronic combined systolic (congestive) and diastolic (congestive) heart failure: Secondary | ICD-10-CM

## 2018-03-22 DIAGNOSIS — Z7289 Other problems related to lifestyle: Secondary | ICD-10-CM | POA: Insufficient documentation

## 2018-03-22 DIAGNOSIS — I10 Essential (primary) hypertension: Secondary | ICD-10-CM

## 2018-03-22 DIAGNOSIS — I340 Nonrheumatic mitral (valve) insufficiency: Secondary | ICD-10-CM | POA: Insufficient documentation

## 2018-03-22 DIAGNOSIS — Z79899 Other long term (current) drug therapy: Secondary | ICD-10-CM | POA: Insufficient documentation

## 2018-03-22 DIAGNOSIS — F1010 Alcohol abuse, uncomplicated: Secondary | ICD-10-CM

## 2018-03-22 LAB — BASIC METABOLIC PANEL
Anion gap: 11 (ref 5–15)
BUN: 17 mg/dL (ref 6–20)
CHLORIDE: 104 mmol/L (ref 98–111)
CO2: 24 mmol/L (ref 22–32)
Calcium: 9.6 mg/dL (ref 8.9–10.3)
Creatinine, Ser: 1.17 mg/dL (ref 0.61–1.24)
GFR calc Af Amer: >60 mL/min (ref >60)
GFR calc non Af Amer: >60 mL/min (ref >60)
Glucose, Bld: 100 mg/dL — ABNORMAL HIGH (ref 70–99)
Potassium: 4.2 mmol/L (ref 3.5–5.1)
Sodium: 139 mmol/L (ref 135–145)

## 2018-03-22 MED ORDER — SPIRONOLACTONE 25 MG PO TABS: 25.0000 mg | ORAL_TABLET | Freq: Every day | ORAL | 1 refills | Status: DC

## 2018-03-22 NOTE — Addendum Note (Signed)
Encounter addended by: Burna Sis, LCSW on: 03/22/2018 2:40 PM  Actions taken: Clinical Note Signed

## 2018-03-22 NOTE — Patient Instructions (Signed)
INCREASE Spironolactone to 25 mg, one tab daily  Labs today We will only contact you if something comes back abnormal or we need to make some changes. Otherwise no news is good news!  Labs needed in 7-10 days   Your physician recommends that you schedule a follow-up appointment in: 3 months with Dr Bensimhon and echo  Your physician has requested that you have an echocardiogram. Echocardiography is a painless test that uses sound waves to create images of your heart. It provides your doctor with information about the size and shape of your heart and how well your heart's chambers and valves are working. This procedure takes approximately one hour. There are no restrictions for this procedure.   Do the following things EVERYDAY: 1) Weigh yourself in the morning before breakfast. Write it down and keep it in a log. 2) Take your medicines as prescribed 3) Eat low salt foods-Limit salt (sodium) to 2000 mg per day.  4) Stay as active as you can everyday 5) Limit all fluids for the day to less than 2 liters   

## 2018-03-22 NOTE — Progress Notes (Signed)
CSW met with pt to discuss concerns regarding insurance and getting a PCP.  Pt was working up until 3 weeks ago when he reports he was told to stop due to his health concerns.  Pt unsure when he will be cleared to go back to work and is concerned about finances and getting appropriate healthcare without insurance.  Pt reports he has never applied for Medicaid or disability before.  CSW explained that he will need to go to the social security office to initiate application as well as apply for Medicaid at [employer]- pt expressed understanding.  CSW provided pt with number for [facility] for PCP appt- informed him that he could also inquire about orange card/blue card at their offices as it is uncertain that pt will qualify for Medicaid.  CSW will continue to follow in clinic and assist as needed  Jenna H. Uris, LCSW Clinical Social Worker [facility] [phone]   

## 2018-04-15 MED FILL — CARVEDILOL 3.125 MG TABLET: 3.125 | 34 days supply | Qty: 68 | Fill #1

## 2018-04-15 MED FILL — SPIRONOLACTONE 25 MG TABLET: 25 | 34 days supply | Qty: 34 | Fill #0

## 2018-04-15 MED FILL — LOSARTAN POTASSIUM 25 MG TA: 25 | 34 days supply | Qty: 34 | Fill #1

## 2018-04-18 ENCOUNTER — Encounter (HOSPITAL_COMMUNITY): Payer: Self-pay

## 2018-05-06 ENCOUNTER — Encounter (HOSPITAL_COMMUNITY): Payer: Self-pay

## 2018-05-06 ENCOUNTER — Ambulatory Visit (HOSPITAL_COMMUNITY)
Admission: RE | Admit: 2018-05-06 | Discharge: 2018-05-06 | Disposition: A | Discharge status: Home / Self Care | Payer: Self-pay | Source: Ambulatory Visit | Attending: Internal Medicine | Admitting: Internal Medicine

## 2018-05-06 VITALS — BP 134/70 | HR 61 | Wt 169.8 lb

## 2018-05-06 DIAGNOSIS — M79662 Pain in left lower leg: Secondary | ICD-10-CM

## 2018-05-06 DIAGNOSIS — I110 Hypertensive heart disease with heart failure: Secondary | ICD-10-CM | POA: Insufficient documentation

## 2018-05-06 DIAGNOSIS — I10 Essential (primary) hypertension: Secondary | ICD-10-CM

## 2018-05-06 DIAGNOSIS — I5042 Chronic combined systolic (congestive) and diastolic (congestive) heart failure: Secondary | ICD-10-CM

## 2018-05-06 DIAGNOSIS — Z833 Family history of diabetes mellitus: Secondary | ICD-10-CM | POA: Insufficient documentation

## 2018-05-06 DIAGNOSIS — Z8249 Family history of ischemic heart disease and other diseases of the circulatory system: Secondary | ICD-10-CM | POA: Insufficient documentation

## 2018-05-06 DIAGNOSIS — Z7289 Other problems related to lifestyle: Secondary | ICD-10-CM | POA: Insufficient documentation

## 2018-05-06 DIAGNOSIS — I428 Other cardiomyopathies: Secondary | ICD-10-CM | POA: Insufficient documentation

## 2018-05-06 DIAGNOSIS — I5023 Acute on chronic systolic (congestive) heart failure: Secondary | ICD-10-CM | POA: Insufficient documentation

## 2018-05-06 DIAGNOSIS — Z79899 Other long term (current) drug therapy: Secondary | ICD-10-CM | POA: Insufficient documentation

## 2018-05-06 DIAGNOSIS — F17290 Nicotine dependence, other tobacco product, uncomplicated: Secondary | ICD-10-CM | POA: Insufficient documentation

## 2018-05-06 DIAGNOSIS — F17200 Nicotine dependence, unspecified, uncomplicated: Secondary | ICD-10-CM

## 2018-05-06 DIAGNOSIS — I340 Nonrheumatic mitral (valve) insufficiency: Secondary | ICD-10-CM | POA: Insufficient documentation

## 2018-05-06 LAB — BASIC METABOLIC PANEL
Anion gap: 12 (ref 5–15)
BUN: 17 mg/dL (ref 6–20)
CHLORIDE: 109 mmol/L (ref 98–111)
CO2: 20 mmol/L — ABNORMAL LOW (ref 22–32)
Calcium: 9.4 mg/dL (ref 8.9–10.3)
Creatinine, Ser: 1.32 mg/dL — ABNORMAL HIGH (ref 0.61–1.24)
GFR calc Af Amer: >60 mL/min (ref >60)
GFR calc non Af Amer: >60 mL/min (ref >60)
Glucose, Bld: 94 mg/dL (ref 70–99)
Potassium: 4.3 mmol/L (ref 3.5–5.1)
Sodium: 141 mmol/L (ref 135–145)

## 2018-05-06 NOTE — Patient Instructions (Addendum)
Labs done today. We will contact you for any abnormal lab work.  Your Advanced Practice Provider advises that you STOP smoking.  Your physician has requested that you have an ankle brachial index (ABI). During this test an ultrasound and blood pressure cuff are used to evaluate the arteries that supply the arms and legs with blood. Allow thirty minutes for this exam. There are no restrictions or special instructions.  TAKE Carvedilol 3.125mg  (1 tab) twice daily  Follow up with the Advanced Practice Provider in 2 weeks.

## 2018-05-06 NOTE — Progress Notes (Signed)
Advanced Heart Failure Clinic Note   PCP: Patient, No Pcp Per PCP-Cardiologist: Nanetta Batty, MD  HF: Dr Gala Romney  HPI: Martin Keller is a 53 y.o. malewith a hx of HTN, tobacco use, systolic HF (diagnosed 03/2018), and severe MR (diagnosed 12/19). He does not have insurance.  He was admitted 12/1-12/5/19 with acute systolic HF. Echo showed EF 30-35% and severe MR. R/LHC completed with new systolic HF, which showed normal coronaries, severe NICM with EF 20-25%, and severe 3-4+ MR. TEE completed to further evaluate MR and showed severe functional MR. HF medications were optimized and provided through HF fund. He was given a scale. DC weight: 155 lbs  Today he returns for HF follow up. Overall feeling fine. Complaining of left calf pain when he is walking. Denies SOB/PND/Orthopnea. Appetite ok. No fever or chills. Not weighing at home. Smoking 3 black cigars daily.  Drinking 2 beers a week. Taking all medications but only taking coreg once a day.   SH: Smokes 2 black and milds/day. no insurance, no problems with transportation, unable to afford medications. Rare ETOH use. He was working as a Hospital doctor (non commercial) until hospitalization.   FH: mother with murmur, died of MI at 57 years, father died of MI in 60s.   Echo 03/12/2018: EF 30-35%, diffuse HK, severe MR, LA severely dilated, RA mod dilated, RV mildly down  [facility] 03/05/18 Ao = 108/68 (84)  LV = 110/15 RA = 2 RV = 39/4 PA = 31/10 (20) PCW = 8 (no significant v-waves) Fick cardiac output/index = 5.0/2.7 SVR = 1307 PVR = 2.4 WU Ao sat = 97% PA sat = 68%, 70%  Assessment: 1. Normal coronaries 2. Severe NICM EF 20-25% 3. 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 Well compensated filling pressures  TEE 03/07/18:  EF 25%, diffuse HK, RV mildly decreased function, trivial TR, severe central mitral regurgitation, ERO 0.6 cm^2 by PISA.  Impression: severe functional mitral regurgitation  Review of systems complete and found to be negative unless  listed in HPI.   Past Medical History:  Diagnosis Date  . Hypertension     Current Outpatient Medications  Medication Sig Dispense Refill  . carvedilol (COREG) 3.125 MG tablet Take 1 tablet (3.125 mg total) by mouth 2 (two) times daily with a meal. 60 tablet 0  . furosemide (LASIX) 40 MG tablet Take 1 tablet (40 mg total) by mouth daily. 30 tablet 1  . losartan (COZAAR) 25 MG tablet Take 1 tablet (25 mg total) by mouth daily. 30 tablet 1  . nicotine (NICODERM CQ - DOSED IN MG/24 HOURS) 14 mg/24hr patch Place 1 patch (14 mg total) onto the skin daily. 28 patch 0  . spironolactone (ALDACTONE) 25 MG tablet Take 1 tablet (25 mg total) by mouth daily. 30 tablet 1   No current facility-administered medications for this encounter.     No Known Allergies    Social History   Socioeconomic History  . Marital status: Divorced    Spouse name: Not on file  . Number of children: Not on file  . Years of education: Not on file  . Highest education level: Not on file  Occupational History  . Not on file  Social Needs  . Financial resource strain: Not on file  . Food insecurity:    Worry: Not on file    Inability: Not on file  . Transportation needs:    Medical: Not on file    Non-medical: Not on file  Tobacco Use  . Smoking status:  Current Every Day Smoker    Types: Cigars  . Smokeless tobacco: Never Used  Substance and Sexual Activity  . Alcohol use: Yes    Frequency: Never  . Drug use: Yes    Types: Marijuana  . Sexual activity: Yes    Partners: Female    Birth control/protection: None  Lifestyle  . Physical activity:    Days per week: Not on file    Minutes per session: Not on file  . Stress: Not on file  Relationships  . Social connections:    Talks on phone: Not on file    Gets together: Not on file    Attends religious service: Not on file    Active member of club or organization: Not on file    Attends meetings of clubs or organizations: Not on file     Relationship status: Not on file  . Intimate partner violence:    Fear of current or ex partner: Not on file    Emotionally abused: Not on file    Physically abused: Not on file    Forced sexual activity: Not on file  Other Topics Concern  . Not on file  Social History Narrative  . Not on file      Family History  Problem Relation Age of Onset  . Heart attack Mother        Died at age of 30 from heart attack.  Marland Kitchen Heart attack Father        Died at age of 72 from heart attack.  Marland Kitchen Heart disease Brother        Heart surgery at age of 81.  . Diabetes Child     Vitals:   05/06/18 1459  BP: 134/70  Pulse: 61  SpO2: 98%  Weight: 77 kg (169 lb 12.8 oz)   Wt Readings from Last 3 Encounters:  05/06/18 77 kg (169 lb 12.8 oz)  03/22/18 73.7 kg (162 lb 6.4 oz)  03/07/18 70.5 kg (155 lb 8 oz)    PHYSICAL EXAM: General:  Well appearing. No resp difficulty HEENT: normal Neck: supple. no JVD. Carotids 2+ bilat; no bruits. No lymphadenopathy or thryomegaly appreciated. Cor: PMI nondisplaced. Regular rate & rhythm. No rubs, gallops or murmurs. Lungs: clear Abdomen: soft, nontender, nondistended. No hepatosplenomegaly. No bruits or masses. Good bowel sounds. Extremities: no cyanosis, clubbing, rash, edema Neuro: alert & orientedx3, cranial nerves grossly intact. moves all 4 extremities w/o difficulty. Affect pleasant  ASSESSMENT & PLAN:  1. Chronic systolic HF due to NICM (new as of 03/2018). LHC normal coronaries 12/3. Etiology unclear - possibly HTN or ETOH.  - Echo 12/2: EF 30-35%, diffuse HK, severe MR, LA severely dilated, RA mod dilated, RV mildly down. TEE 12/5: EF 25%, severe functional MR NYHA I. Volume status stable. Continue lasix 40 mg daily. BMET today.  - Continue coreg 3.125 mg BID. I have asked him to take this medication twice a day.  -Continue spiro to 25 mg daily  - Continue losartan 25 daily. Considered switching to entresto but I am not sure he will take the  medication twice a day.  Repeat ECHO in March.  Has a narrow QRS so will not be CRT candidate.   2. Severe MR - TEE 03/07/18: EF 25%, diffuse HK, RV mildly decreased function, trivial TR, severe central mitral regurgitation, ERO 0.6 cm^2 by PISA.  Impression: severe functional mitral regurgitation.  Repeat ECHO in [place]   3. HTN - Stable.   4. Tobacco use -  Smoking 2 black and milds/day. - Discussed smoking cessation   5. ETOH use - Encouraged complete cessation  6. No insurance Referred to CSW  7. Claudication  LLE ? Check ABI  Discussed smoking cessation.   Follow up in 3 weeks. Plan to set up ECHo at that time.    Greater than 50% of the (total minutes 25 ) visit spent in counseling/coordination of care regarding medication compliance and tests.   Tonye Becket, NP 05/06/18

## 2018-05-14 ENCOUNTER — Other Ambulatory Visit (HOSPITAL_COMMUNITY): Payer: Self-pay | Admitting: Adult Health

## 2018-05-14 DIAGNOSIS — I739 Peripheral vascular disease, unspecified: Secondary | ICD-10-CM

## 2018-05-14 DIAGNOSIS — M79662 Pain in left lower leg: Secondary | ICD-10-CM

## 2018-05-27 ENCOUNTER — Ambulatory Visit (HOSPITAL_COMMUNITY): Admission: RE | Admit: 2018-05-27 | Payer: Self-pay | Source: Ambulatory Visit

## 2018-05-27 ENCOUNTER — Encounter (HOSPITAL_COMMUNITY): Payer: Self-pay

## 2018-06-20 ENCOUNTER — Other Ambulatory Visit (HOSPITAL_COMMUNITY): Payer: Self-pay | Admitting: *Deleted

## 2018-06-20 MED ORDER — SPIRONOLACTONE 25 MG PO TABS: 25.0000 mg | ORAL_TABLET | Freq: Every day | ORAL | 1 refills | Status: DC

## 2018-06-20 MED ORDER — FUROSEMIDE 40 MG PO TABS: 40.0000 mg | ORAL_TABLET | Freq: Every day | ORAL | 1 refills | Status: DC

## 2018-06-20 MED ORDER — LOSARTAN POTASSIUM 25 MG PO TABS: 25.0000 mg | ORAL_TABLET | Freq: Every day | ORAL | 1 refills | Status: DC

## 2018-06-20 MED ORDER — CARVEDILOL 3.125 MG PO TABS: 3.1250 mg | ORAL_TABLET | Freq: Two times a day (BID) | ORAL | 2 refills | Status: DC

## 2018-06-20 MED FILL — SPIRONOLACTONE 25 MG TABLET: 25 | 30 days supply | Qty: 30 | Fill #0

## 2018-06-20 MED FILL — CARVEDILOL 3.125 MG TABLET: 3.125 | 30 days supply | Qty: 60 | Fill #0

## 2018-06-20 MED FILL — FUROSEMIDE 40 MG TAB: 40 | 30 days supply | Qty: 30 | Fill #0

## 2018-06-20 MED FILL — LOSARTAN POTASSIUM 25 MG TA: 25 | 30 days supply | Qty: 30 | Fill #0

## 2018-06-25 ENCOUNTER — Ambulatory Visit (HOSPITAL_COMMUNITY): Payer: Self-pay

## 2018-06-25 ENCOUNTER — Encounter (HOSPITAL_COMMUNITY): Payer: Self-pay | Admitting: Internal Medicine

## 2018-08-01 ENCOUNTER — Telehealth (HOSPITAL_COMMUNITY): Payer: Self-pay | Admitting: Vascular Surgery

## 2018-08-01 NOTE — Telephone Encounter (Signed)
Left pt message appt 5/20 w/ echo would be canceled due to Covid 19, asked pt to call office to reschedule later this summer, or set up televist w/ PA/NP if he is having problems, pt will be put on list

## 2018-08-21 ENCOUNTER — Encounter (HOSPITAL_COMMUNITY): Payer: Self-pay | Admitting: Internal Medicine

## 2018-08-21 ENCOUNTER — Other Ambulatory Visit (HOSPITAL_COMMUNITY): Payer: Self-pay

## 2018-10-22 MED FILL — SPIRONOLACTONE 25 MG TABLET: 25 | 30 days supply | Qty: 30 | Fill #1

## 2018-10-22 MED FILL — LOSARTAN POTASSIUM 25 MG TA: 25 | 30 days supply | Qty: 30 | Fill #1

## 2018-11-18 ENCOUNTER — Inpatient Hospital Stay (HOSPITAL_COMMUNITY): Admission: RE | Admit: 2018-11-18 | Payer: Self-pay | Source: Ambulatory Visit | Admitting: Internal Medicine

## 2018-11-18 ENCOUNTER — Ambulatory Visit (HOSPITAL_COMMUNITY): Admission: RE | Admit: 2018-11-18 | Payer: Self-pay | Source: Ambulatory Visit

## 2018-11-19 ENCOUNTER — Other Ambulatory Visit: Payer: Self-pay

## 2018-11-19 ENCOUNTER — Ambulatory Visit (HOSPITAL_COMMUNITY)
Admission: RE | Admit: 2018-11-19 | Discharge: 2018-11-19 | Disposition: A | Discharge status: Home / Self Care | Payer: Self-pay | Source: Ambulatory Visit | Attending: Internal Medicine | Admitting: Internal Medicine

## 2018-11-19 DIAGNOSIS — I110 Hypertensive heart disease with heart failure: Secondary | ICD-10-CM | POA: Insufficient documentation

## 2018-11-19 DIAGNOSIS — I080 Rheumatic disorders of both mitral and aortic valves: Secondary | ICD-10-CM | POA: Insufficient documentation

## 2018-11-19 DIAGNOSIS — I5042 Chronic combined systolic (congestive) and diastolic (congestive) heart failure: Secondary | ICD-10-CM | POA: Insufficient documentation

## 2018-11-19 NOTE — Progress Notes (Signed)
  Echocardiogram 2D Echocardiogram has been performed.  Martin Keller 11/19/2018, 12:27 PM

## 2018-12-02 ENCOUNTER — Other Ambulatory Visit (HOSPITAL_COMMUNITY): Payer: Self-pay | Admitting: Adult Health

## 2018-12-02 MED FILL — FUROSEMIDE 40 MG TAB: 40 | 30 days supply | Qty: 30 | Fill #1

## 2018-12-02 MED FILL — LOSARTAN POTASSIUM 25 MG TA: 25 | 34 days supply | Qty: 34 | Fill #0

## 2018-12-02 MED FILL — SPIRONOLACTONE 25 MG TABLET: 25 | 30 days supply | Qty: 30 | Fill #0

## 2018-12-02 MED FILL — CARVEDILOL 3.125 MG TABLET: 3.125 | 30 days supply | Qty: 60 | Fill #1

## 2018-12-11 ENCOUNTER — Encounter (HOSPITAL_COMMUNITY): Payer: Self-pay | Admitting: Cardiology

## 2018-12-11 NOTE — Progress Notes (Unsigned)
Opened in error

## 2018-12-25 ENCOUNTER — Other Ambulatory Visit (HOSPITAL_COMMUNITY): Payer: Self-pay | Admitting: Adult Health

## 2018-12-25 DIAGNOSIS — I739 Peripheral vascular disease, unspecified: Secondary | ICD-10-CM

## 2019-01-01 ENCOUNTER — Telehealth (HOSPITAL_COMMUNITY): Payer: Self-pay

## 2019-01-01 ENCOUNTER — Other Ambulatory Visit: Payer: Self-pay

## 2019-01-01 ENCOUNTER — Ambulatory Visit (HOSPITAL_COMMUNITY)
Admission: RE | Admit: 2019-01-01 | Discharge: 2019-01-01 | Disposition: A | Discharge status: Home / Self Care | Payer: Self-pay | Source: Ambulatory Visit | Attending: Cardiovascular Disease | Admitting: Cardiovascular Disease

## 2019-01-01 DIAGNOSIS — M79662 Pain in left lower leg: Secondary | ICD-10-CM | POA: Insufficient documentation

## 2019-01-01 DIAGNOSIS — I739 Peripheral vascular disease, unspecified: Secondary | ICD-10-CM

## 2019-01-01 NOTE — Telephone Encounter (Signed)
-----   Message from Amy D Clegg, NP sent at 01/01/2019  3:48 PM EDT ----- Please call. Test result is concerning for limited blood flow in left leg. Please refer to Vascular Surgeon for consultation. 

## 2019-01-01 NOTE — Telephone Encounter (Signed)
Called pt to review results from ABI. Pt verbalized understanding. Called [facility] to speed up referral to Dr. Donzetta Matters. They said they would be giving the pt a call to set up an appointment.

## 2019-01-01 NOTE — Telephone Encounter (Signed)
-----   Message from Conrad Woodland, NP sent at 01/01/2019  3:48 PM EDT ----- Please call. Test result is concerning for limited blood flow in left leg. Please refer to Vascular Surgeon for consultation.

## 2019-01-22 ENCOUNTER — Other Ambulatory Visit (HOSPITAL_COMMUNITY): Payer: Self-pay | Admitting: *Deleted

## 2019-01-22 ENCOUNTER — Encounter (HOSPITAL_COMMUNITY): Payer: Self-pay | Admitting: Internal Medicine

## 2019-01-22 MED ORDER — SPIRONOLACTONE 25 MG PO TABS: 25.0000 mg | ORAL_TABLET | Freq: Every day | ORAL | 1 refills | Status: DC

## 2019-01-22 MED ORDER — CARVEDILOL 3.125 MG PO TABS: 3.1250 mg | ORAL_TABLET | Freq: Two times a day (BID) | ORAL | 2 refills | Status: DC

## 2019-01-22 MED ORDER — FUROSEMIDE 40 MG PO TABS: 40.0000 mg | ORAL_TABLET | Freq: Every day | ORAL | 1 refills | Status: DC

## 2019-01-22 MED ORDER — LOSARTAN POTASSIUM 25 MG PO TABS: 25.0000 mg | ORAL_TABLET | Freq: Every day | ORAL | 1 refills | Status: DC

## 2019-01-22 MED FILL — FUROSEMIDE 40 MG TAB: 40 | 30 days supply | Qty: 30 | Fill #0

## 2019-01-22 MED FILL — CARVEDILOL 3.125 MG TABLET: 3.125 | 30 days supply | Qty: 60 | Fill #0

## 2019-01-22 MED FILL — LOSARTAN POTASSIUM 25 MG TA: 25 | 30 days supply | Qty: 30 | Fill #0

## 2019-01-22 MED FILL — SPIRONOLACTONE 25 MG TABS: 25 | 30 days supply | Qty: 30 | Fill #0

## 2019-01-28 ENCOUNTER — Telehealth (HOSPITAL_COMMUNITY): Payer: Self-pay

## 2019-01-28 NOTE — Telephone Encounter (Signed)
Received a request for Medical Records from [facility]. Faxed to [phone] on 01/28/2019.

## 2019-02-07 ENCOUNTER — Other Ambulatory Visit: Payer: Self-pay

## 2019-02-07 DIAGNOSIS — I739 Peripheral vascular disease, unspecified: Secondary | ICD-10-CM

## 2019-02-11 ENCOUNTER — Ambulatory Visit (HOSPITAL_COMMUNITY)
Admission: RE | Admit: 2019-02-11 | Discharge: 2019-02-11 | Disposition: A | Discharge status: Home / Self Care | Payer: Self-pay | Source: Ambulatory Visit | Attending: Internal Medicine | Admitting: Internal Medicine

## 2019-02-11 ENCOUNTER — Encounter (HOSPITAL_COMMUNITY): Payer: Self-pay | Admitting: Internal Medicine

## 2019-02-11 ENCOUNTER — Other Ambulatory Visit (HOSPITAL_COMMUNITY): Payer: Self-pay | Admitting: *Deleted

## 2019-02-11 ENCOUNTER — Other Ambulatory Visit: Payer: Self-pay

## 2019-02-11 VITALS — BP 138/80 | HR 66 | Wt 165.1 lb

## 2019-02-11 DIAGNOSIS — F1010 Alcohol abuse, uncomplicated: Secondary | ICD-10-CM

## 2019-02-11 DIAGNOSIS — Z7289 Other problems related to lifestyle: Secondary | ICD-10-CM | POA: Insufficient documentation

## 2019-02-11 DIAGNOSIS — Z833 Family history of diabetes mellitus: Secondary | ICD-10-CM | POA: Insufficient documentation

## 2019-02-11 DIAGNOSIS — I10 Essential (primary) hypertension: Secondary | ICD-10-CM

## 2019-02-11 DIAGNOSIS — R200 Anesthesia of skin: Secondary | ICD-10-CM | POA: Insufficient documentation

## 2019-02-11 DIAGNOSIS — I428 Other cardiomyopathies: Secondary | ICD-10-CM | POA: Insufficient documentation

## 2019-02-11 DIAGNOSIS — I110 Hypertensive heart disease with heart failure: Secondary | ICD-10-CM | POA: Insufficient documentation

## 2019-02-11 DIAGNOSIS — I340 Nonrheumatic mitral (valve) insufficiency: Secondary | ICD-10-CM

## 2019-02-11 DIAGNOSIS — Z8249 Family history of ischemic heart disease and other diseases of the circulatory system: Secondary | ICD-10-CM | POA: Insufficient documentation

## 2019-02-11 DIAGNOSIS — I739 Peripheral vascular disease, unspecified: Secondary | ICD-10-CM

## 2019-02-11 DIAGNOSIS — Z79899 Other long term (current) drug therapy: Secondary | ICD-10-CM | POA: Insufficient documentation

## 2019-02-11 DIAGNOSIS — Z720 Tobacco use: Secondary | ICD-10-CM

## 2019-02-11 DIAGNOSIS — I5022 Chronic systolic (congestive) heart failure: Secondary | ICD-10-CM

## 2019-02-11 DIAGNOSIS — F17290 Nicotine dependence, other tobacco product, uncomplicated: Secondary | ICD-10-CM | POA: Insufficient documentation

## 2019-02-11 LAB — BASIC METABOLIC PANEL
Anion gap: 13 (ref 5–15)
BUN: 11 mg/dL (ref 6–20)
CO2: 22 mmol/L (ref 22–32)
Calcium: 10.3 mg/dL (ref 8.9–10.3)
Chloride: 105 mmol/L (ref 98–111)
Creatinine, Ser: 1.2 mg/dL (ref 0.61–1.24)
GFR calc Af Amer: >60 mL/min (ref >60)
GFR calc non Af Amer: >60 mL/min (ref >60)
Glucose, Bld: 101 mg/dL — ABNORMAL HIGH (ref 70–99)
Potassium: 4 mmol/L (ref 3.5–5.1)
Sodium: 140 mmol/L (ref 135–145)

## 2019-02-11 LAB — BRAIN NATRIURETIC PEPTIDE: B Natriuretic Peptide: 128.7 pg/mL — ABNORMAL HIGH (ref 0.0–100.0)

## 2019-02-11 MED ORDER — FUROSEMIDE 40 MG PO TABS: 40.0000 mg | ORAL_TABLET | Freq: Every day | ORAL | 1 refills | Status: DC

## 2019-02-11 MED ORDER — ENTRESTO 49-51 MG PO TABS: 1.0000 | ORAL_TABLET | Freq: Two times a day (BID) | ORAL | 3 refills | Status: DC

## 2019-02-11 MED ORDER — SPIRONOLACTONE 25 MG PO TABS: 25.0000 mg | ORAL_TABLET | Freq: Every day | ORAL | 1 refills | Status: DC

## 2019-02-11 MED ORDER — LOSARTAN POTASSIUM 25 MG PO TABS: 25.0000 mg | ORAL_TABLET | Freq: Every day | ORAL | 1 refills | Status: DC

## 2019-02-11 MED ORDER — CARVEDILOL 3.125 MG PO TABS: 3.1250 mg | ORAL_TABLET | Freq: Two times a day (BID) | ORAL | 2 refills | Status: DC

## 2019-02-11 MED FILL — ENTRESTO 49 MG-51 MG TABLET: 49-51 | 30 days supply | Qty: 60 | Fill #0

## 2019-02-11 MED FILL — FUROSEMIDE 40 MG TAB: 40 | 30 days supply | Qty: 30 | Fill #0

## 2019-02-11 NOTE — Patient Instructions (Addendum)
MAKE SURE TO TAKE YOUR CARVEDILOL Twice daily (every morning when you get up and every evening before bed)  STOP Losartan  Start Entresto 49/51 mg Twice daily, we have given you a free 30-day card to get the first month and will sign you up for the [employer] Patient Assist program to get the medication for free.  Labs done today, we will let you know about any abnormal results  Your physician recommends that you schedule a follow-up appointment in: 2 months  If you have any questions or concerns before your next appointment please send [place] a message through [place] or call our office at [phone].  At the [facility], you and your health needs are our priority. As part of our continuing mission to provide you with exceptional heart care, we have created designated Provider Care Teams. These Care Teams include your primary Cardiologist (physician) and Advanced Practice Providers (APPs- Physician Assistants and Nurse Practitioners) who all work together to provide you with the care you need, when you need it.   You may see any of the following providers on your designated Care Team at your next follow up: Marland Kitchen Dr Glori Bickers . Dr Loralie Champagne . Darrick Grinder, NP . Lyda Jester, PA   Please be sure to bring in all your medications bottles to every appointment.

## 2019-02-11 NOTE — Progress Notes (Signed)
Advanced Heart Failure Clinic Note   PCP: Patient, No Pcp Per PCP-Cardiologist: Nanetta Batty, MD  HF: Dr Gala Romney  HPI: Martin Keller is a 53 y.o. malewith a hx of HTN, tobacco use, systolic HF (diagnosed 03/2018), and severe MR (diagnosed 12/19). He does not have insurance.  He was admitted 12/1-12/5/19 with acute systolic HF. Echo showed EF 30-35% and severe MR. R/LHC showed normal coronaries, severe NICM with EF 20-25%, and severe 3-4+ MR. TEE completed to further evaluate MR and showed severe functional MR. HF medications were optimized and provided through HF fund. He was given a scale. DC weight: 155 lbs  Today he returns for HF follow up. Overall feeling ok. Breathing good. No CP. No edema, orthopnea or PND. Says left arm going numb for months. Has L>R claudication at 1 block. Seeing Dr. Randie Heinz on Friday. Smoking 2-3 black cigars daily.  Drinking a few beers a week (22oz and some liquor). Taking all medications as prescribed except taking carvedilol only once a day.   Echo 8/20 EF 30-35% RV mildly down Moderate MR  Personally reviewed  SH: Smokes 2 black and milds/day. no insurance, no problems with transportation, unable to afford medications. Rare ETOH use. He was working as a Hospital doctor (non commercial) until hospitalization.   FH: mother with murmur, died of MI at 50 years, father died of MI in 59s.     Echo 2018-03-25: EF 30-35%, diffuse HK, severe MR, LA severely dilated, RA mod dilated, RV mildly down  [facility] 03/05/18 Ao = 108/68 (84)  LV = 110/15 RA = 2 RV = 39/4 PA = 31/10 (20) PCW = 8 (no significant v-waves) Fick cardiac output/index = 5.0/2.7 SVR = 1307 PVR = 2.4 WU Ao sat = 97% PA sat = 68%, 70%  Assessment: 1. Normal coronaries 2. Severe NICM EF 20-25% 3. 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 Well compensated filling pressures  TEE 03/07/18:  EF 25%, diffuse HK, RV mildly decreased function, trivial TR, severe central mitral regurgitation, ERO 0.6 cm^2 by PISA.   Impression: severe functional mitral regurgitation  Review of systems complete and found to be negative unless listed in HPI.   Past Medical History:  Diagnosis Date  . Hypertension     Current Outpatient Medications  Medication Sig Dispense Refill  . carvedilol (COREG) 3.125 MG tablet Take 1 tablet (3.125 mg total) by mouth 2 (two) times daily with a meal. 60 tablet 2  . furosemide (LASIX) 40 MG tablet Take 1 tablet (40 mg total) by mouth daily. 30 tablet 1  . losartan (COZAAR) 25 MG tablet Take 1 tablet (25 mg total) by mouth daily. 30 tablet 1  . spironolactone (ALDACTONE) 25 MG tablet Take 1 tablet (25 mg total) by mouth daily. 30 tablet 1   No current facility-administered medications for this encounter.     No Known Allergies    Social History   Socioeconomic History  . Marital status: Divorced    Spouse name: Not on file  . Number of children: Not on file  . Years of education: Not on file  . Highest education level: Not on file  Occupational History  . Not on file  Social Needs  . Financial resource strain: Not on file  . Food insecurity    Worry: Not on file    Inability: Not on file  . Transportation needs    Medical: Not on file    Non-medical: Not on file  Tobacco Use  . Smoking status: Current Every Day Smoker  Types: Cigars  . Smokeless tobacco: Never Used  Substance and Sexual Activity  . Alcohol use: Yes    Frequency: Never  . Drug use: Yes    Types: Marijuana  . Sexual activity: Yes    Partners: Female    Birth control/protection: None  Lifestyle  . Physical activity    Days per week: Not on file    Minutes per session: Not on file  . Stress: Not on file  Relationships  . Social Herbalist on phone: Not on file    Gets together: Not on file    Attends religious service: Not on file    Active member of club or organization: Not on file    Attends meetings of clubs or organizations: Not on file    Relationship status: Not on  file  . Intimate partner violence    Fear of current or ex partner: Not on file    Emotionally abused: Not on file    Physically abused: Not on file    Forced sexual activity: Not on file  Other Topics Concern  . Not on file  Social History Narrative  . Not on file      Family History  Problem Relation Age of Onset  . Heart attack Mother        Died at age of 43 from heart attack.  Marland Kitchen Heart attack Father        Died at age of 34 from heart attack.  Marland Kitchen Heart disease Brother        Heart surgery at age of 52.  . Diabetes Child     Vitals:   02/11/19 1437  BP: 138/80  Pulse: 66  SpO2: 98%  Weight: 74.9 kg (165 lb 2 oz)   Wt Readings from Last 3 Encounters:  02/11/19 74.9 kg (165 lb 2 oz)  05/06/18 77 kg (169 lb 12.8 oz)  03/22/18 73.7 kg (162 lb 6.4 oz)    PHYSICAL EXAM: General:  Well appearing. No resp difficulty HEENT: normal Neck: supple. no JVD. Carotids 2+ bilat; no bruits. No lymphadenopathy or thryomegaly appreciated. Cor: PMI nondisplaced. Regular rate & rhythm. 2/6 MR. Lungs: clear with prolonged expiratory phase Abdomen: soft, nontender, nondistended. No hepatosplenomegaly. No bruits or masses. Good bowel sounds. Extremities: no cyanosis, clubbing, rash, edema Neuro: alert & orientedx3, cranial nerves grossly intact. moves all 4 extremities w/o difficulty. Affect pleasant   ASSESSMENT & PLAN:  1. Chronic systolic HF due to NICM (new as of 03/2018). LHC normal coronaries 12/3. Etiology unclear - possibly HTN or ETOH.  - Echo 12/19: EF 30-35%, diffuse HK, severe MR, LA severely dilated, RA mod dilated, RV mildly down.  - TEE 12/19: EF 25%, severe functional MR - Echo 8/20 EF 25-30% moderate MR - NYHA I-II.  - Volume status ok. Continue lasix 40 mg daily. BMET today.  - Increase coreg to 3.125 mg BID (has been taking daily despite previous instructions) . - Continue spiro to 25 mg daily  - Switch losartan to Entresto 49/51 bid. PharmD to see  - Not  interested in ICD at this point   2. Severe MR - TEE 03/07/18: EF 25%, diffuse HK, RV mildly decreased function, trivial TR, severe central mitral regurgitation, ERO 0.6 cm^2 by PISA.  Impression: severe functional mitral regurgitation.  - Moderate by echo in 8/20.  - Functional MR - should improve with treatment of HF  3. HTN - BP mildly elevated. Switching losartan to [employer]  4. Tobacco use - Smoking 2 black and milds/day. - Discussed smoking cessation again today   5. ETOH use - Encouraged complete cessation again today  6. Claudication  - Has appt with [facility] this week  7. L arm numbness - suspect cervical disk.  - suggested f/u with ortho or neruo   Arvilla Meres, MD 02/11/19

## 2019-02-11 NOTE — Progress Notes (Signed)
Called patient to make sure he knows to stop his Losartan since we started him on Entresto today, he is aware and verbalizes understanding.

## 2019-02-13 ENCOUNTER — Telehealth (HOSPITAL_COMMUNITY): Payer: Self-pay

## 2019-02-13 NOTE — Telephone Encounter (Signed)

## 2019-02-14 ENCOUNTER — Telehealth (HOSPITAL_COMMUNITY): Payer: Self-pay | Admitting: Pharmacist

## 2019-02-14 ENCOUNTER — Encounter: Payer: Self-pay | Admitting: Vascular Surgery

## 2019-02-14 ENCOUNTER — Ambulatory Visit (INDEPENDENT_AMBULATORY_CARE_PROVIDER_SITE_OTHER): Payer: Self-pay | Admitting: Vascular Surgery

## 2019-02-14 ENCOUNTER — Other Ambulatory Visit: Payer: Self-pay

## 2019-02-14 ENCOUNTER — Ambulatory Visit (HOSPITAL_COMMUNITY)
Admission: RE | Admit: 2019-02-14 | Discharge: 2019-02-14 | Disposition: A | Discharge status: Home / Self Care | Payer: Self-pay | Source: Ambulatory Visit | Attending: Vascular Surgery | Admitting: Vascular Surgery

## 2019-02-14 VITALS — BP 119/73 | HR 66 | Temp 97.6°F | Resp 20 | Ht 70.0 in | Wt 164.0 lb

## 2019-02-14 DIAGNOSIS — I739 Peripheral vascular disease, unspecified: Secondary | ICD-10-CM

## 2019-02-14 NOTE — Telephone Encounter (Signed)
Sent in Manufacturer's Assistance application to [employer] for Entresto.   Phone:[phone] Fax: [phone]  Application pending, will continue to follow.  Layci Stenglein, PharmD, BCPS, CPP Heart Failure Clinic Pharmacist [phone]  

## 2019-02-14 NOTE — Progress Notes (Signed)
Patient ID: Martin Keller, male   DOB: 05-12-1965, 53 y.o.   MRN: [id number]  Reason for Consult: New Patient (Initial Visit)   Referred by Conrad Piney, NP  Subjective:     HPI:  Martin Keller is a 53 y.o. male history of bilateral lower extremity pain.  He does have hyperlipidemia, hypertension and is a former cigarette smoker currently smokes Black and milds. states that he is pain at rest but mostly with ambulation.  He has very short distance cramping of his left calf probably 25 to 30 feet.  Takes longer on the right side may be 50 feet.  Does not have tissue loss or ulceration.  Denies previous stroke.  There is no family history of aneurysm.  Past Medical History:  Diagnosis Date  . Hyperlipidemia   . Hypertension    Family History  Problem Relation Age of Onset  . Heart attack Mother        Died at age of 22 from heart attack.  Marland Kitchen Heart attack Father        Died at age of 22 from heart attack.  Marland Kitchen Heart disease Brother        Heart surgery at age of 38.  . Diabetes Child    Past Surgical History:  Procedure Laterality Date  . RIGHT/LEFT HEART CATH AND CORONARY ANGIOGRAPHY N/A 03/05/2018   Procedure: RIGHT/LEFT HEART CATH AND CORONARY ANGIOGRAPHY;  Surgeon: Jolaine Artist, MD;  Location: [facility];  Service: Cardiovascular;  Laterality: N/A;  . TEE WITHOUT CARDIOVERSION N/A 03/07/2018   Procedure: TRANSESOPHAGEAL ECHOCARDIOGRAM (TEE);  Surgeon: Larey Dresser, MD;  Location: [facility];  Service: Cardiovascular;  Laterality: N/A;    Short Social History:  Social History   Tobacco Use  . Smoking status: Current Every Day Smoker    Types: Cigars  . Smokeless tobacco: Never Used  Substance Use Topics  . Alcohol use: Yes    Frequency: Never    No Known Allergies  Current Outpatient Medications  Medication Sig Dispense Refill  . carvedilol (COREG) 3.125 MG tablet Take 1 tablet (3.125 mg total) by mouth 2 (two) times daily with a meal. 60 tablet 2   . furosemide (LASIX) 40 MG tablet Take 1 tablet (40 mg total) by mouth daily. 30 tablet 1  . sacubitril-valsartan (ENTRESTO) 49-51 MG Take 1 tablet by mouth 2 (two) times daily. 60 tablet 3  . spironolactone (ALDACTONE) 25 MG tablet Take 1 tablet (25 mg total) by mouth daily. 30 tablet 1   No current facility-administered medications for this visit.     Review of Systems  Constitutional:  Constitutional negative. HENT: HENT negative.  Eyes: Eyes negative.  Cardiovascular: Positive for claudication.  GI: Gastrointestinal negative.  Musculoskeletal: Positive for gait problem and leg pain.  Skin: Negative for wound.  Hematologic: Hematologic/lymphatic negative.  Psychiatric: Psychiatric negative.        Objective:   Vitals:   02/14/19 1429  BP: 119/73  Pulse: 66  Resp: 20  Temp: 97.6 F (36.4 C)  SpO2: 96%    Physical Exam HENT:     Head: Normocephalic.     Nose: Nose normal.     Mouth/Throat:     Mouth: Mucous membranes are moist.  Eyes:     Pupils: Pupils are equal, round, and reactive to light.  Neck:     Vascular: No carotid bruit.  Cardiovascular:     Pulses:  Radial pulses are 2+ on the right side and 2+ on the left side.       Femoral pulses are 2+ on the right side and 2+ on the left side.      Popliteal pulses are 0 on the right side and 0 on the left side.  Pulmonary:     Effort: Pulmonary effort is normal.  Abdominal:     General: Abdomen is flat.     Palpations: Abdomen is soft.  Skin:    General: Skin is warm.     Capillary Refill: Capillary refill takes less than 2 seconds.  Neurological:     General: No focal deficit present.     Mental Status: He is alert.  Psychiatric:        Mood and Affect: Mood normal.        Behavior: Behavior normal.        Thought Content: Thought content normal.        Judgment: Judgment normal.     Data: I have independently interpreted his ABIs to be right-sided 0.53 and left-sided 0.64.      Assessment/Plan:     53 year old male with very short distance claudication on the left side may even have rest pain versus multifactorial pain.  By physical exam has occluded SFAs bilaterally.  We will begin with angiogram from right common femoral approach as the left side is worse and we will evaluate this for possible intervention including atherectomy with possible balloon angioplasty possible stenting.  I discussed with him the possible outcomes of this including possible need for bypass surgery.  He demonstrates good understanding.We will get him set up today.     Maeola Harman MD [facility]

## 2019-02-21 NOTE — Telephone Encounter (Signed)
Advanced Heart Failure Patient Advocate Encounter   Patient was approved to receive Entresto from [employer].  Patient ID: [id number] Effective dates: 02/20/19 through 02/20/2020  Audry Riles, PharmD, BCPS, BCCP, CPP Heart Failure Clinic Pharmacist [phone]

## 2019-02-24 ENCOUNTER — Ambulatory Visit (HOSPITAL_COMMUNITY): Admission: RE | Admit: 2019-02-24 | Payer: Self-pay | Source: Home / Self Care | Admitting: Vascular Surgery

## 2019-02-24 ENCOUNTER — Other Ambulatory Visit: Payer: Self-pay

## 2019-02-24 ENCOUNTER — Encounter (HOSPITAL_COMMUNITY): Admission: RE | Payer: Self-pay | Source: Home / Self Care

## 2019-02-24 SURGERY — ABDOMINAL AORTOGRAM W/LOWER EXTREMITY
Anesthesia: LOCAL | Laterality: Bilateral

## 2019-03-07 ENCOUNTER — Other Ambulatory Visit (HOSPITAL_COMMUNITY)
Admission: RE | Admit: 2019-03-07 | Discharge: 2019-03-07 | Disposition: A | Discharge status: Home / Self Care | Payer: Self-pay | Source: Ambulatory Visit | Attending: Vascular Surgery | Admitting: Vascular Surgery

## 2019-03-07 DIAGNOSIS — Z20828 Contact with and (suspected) exposure to other viral communicable diseases: Secondary | ICD-10-CM | POA: Insufficient documentation

## 2019-03-07 DIAGNOSIS — Z01812 Encounter for preprocedural laboratory examination: Secondary | ICD-10-CM | POA: Insufficient documentation

## 2019-03-07 LAB — SARS CORONAVIRUS 2 (TAT 6-24 HRS): SARS Coronavirus 2: NEGATIVE

## 2019-03-10 ENCOUNTER — Other Ambulatory Visit: Payer: Self-pay

## 2019-03-10 ENCOUNTER — Encounter (HOSPITAL_COMMUNITY)
Admission: RE | Disposition: A | Discharge status: Home / Self Care | Payer: Self-pay | Source: Home / Self Care | Attending: Vascular Surgery

## 2019-03-10 ENCOUNTER — Encounter (HOSPITAL_COMMUNITY): Payer: Self-pay | Admitting: Vascular Surgery

## 2019-03-10 ENCOUNTER — Ambulatory Visit (HOSPITAL_COMMUNITY)
Admission: RE | Admit: 2019-03-10 | Discharge: 2019-03-10 | Disposition: A | Discharge status: Home / Self Care | Payer: Medicaid Other | Attending: Vascular Surgery | Admitting: Vascular Surgery

## 2019-03-10 DIAGNOSIS — I70212 Atherosclerosis of native arteries of extremities with intermittent claudication, left leg: Secondary | ICD-10-CM | POA: Diagnosis present

## 2019-03-10 DIAGNOSIS — E785 Hyperlipidemia, unspecified: Secondary | ICD-10-CM | POA: Insufficient documentation

## 2019-03-10 DIAGNOSIS — F17290 Nicotine dependence, other tobacco product, uncomplicated: Secondary | ICD-10-CM | POA: Diagnosis not present

## 2019-03-10 DIAGNOSIS — M79605 Pain in left leg: Secondary | ICD-10-CM

## 2019-03-10 DIAGNOSIS — M79604 Pain in right leg: Secondary | ICD-10-CM

## 2019-03-10 DIAGNOSIS — Z79899 Other long term (current) drug therapy: Secondary | ICD-10-CM | POA: Diagnosis not present

## 2019-03-10 DIAGNOSIS — I10 Essential (primary) hypertension: Secondary | ICD-10-CM | POA: Diagnosis not present

## 2019-03-10 HISTORY — PX: PERIPHERAL VASCULAR BALLOON ANGIOPLASTY: CATH118281

## 2019-03-10 HISTORY — PX: ABDOMINAL AORTOGRAM W/LOWER EXTREMITY: CATH118223

## 2019-03-10 HISTORY — PX: PERIPHERAL VASCULAR ATHERECTOMY: CATH118256

## 2019-03-10 LAB — POCT I-STAT, CHEM 8
BUN: 10 mg/dL (ref 6–20)
Calcium, Ion: 1.02 mmol/L — ABNORMAL LOW (ref 1.15–1.40)
Chloride: 109 mmol/L (ref 98–111)
Creatinine, Ser: 1.1 mg/dL (ref 0.61–1.24)
Glucose, Bld: 111 mg/dL — ABNORMAL HIGH (ref 70–99)
HCT: 42 % (ref 39.0–52.0)
Hemoglobin: 14.3 g/dL (ref 13.0–17.0)
Potassium: 5.2 mmol/L — ABNORMAL HIGH (ref 3.5–5.1)
Sodium: 140 mmol/L (ref 135–145)
TCO2: 27 mmol/L (ref 22–32)

## 2019-03-10 SURGERY — ABDOMINAL AORTOGRAM W/LOWER EXTREMITY
Anesthesia: LOCAL

## 2019-03-10 MED ORDER — CLOPIDOGREL BISULFATE 300 MG PO TABS
ORAL_TABLET | ORAL | Status: AC
  Filled 2019-03-10: qty 1

## 2019-03-10 MED ORDER — MORPHINE SULFATE (PF) 2 MG/ML IV SOLN: 2.0000 mg | INTRAVENOUS | Status: DC | PRN

## 2019-03-10 MED ORDER — LIDOCAINE HCL (PF) 1 % IJ SOLN
INTRAMUSCULAR | Status: AC
  Filled 2019-03-10: qty 30

## 2019-03-10 MED ORDER — LIDOCAINE HCL (PF) 1 % IJ SOLN
INTRAMUSCULAR | Status: DC | PRN
  Administered 2019-03-10: 15 mL via INTRADERMAL

## 2019-03-10 MED ORDER — FENTANYL CITRATE (PF) 100 MCG/2ML IJ SOLN
INTRAMUSCULAR | Status: AC
  Filled 2019-03-10: qty 2

## 2019-03-10 MED ORDER — CLOPIDOGREL BISULFATE 300 MG PO TABS
ORAL_TABLET | ORAL | Status: DC | PRN
  Administered 2019-03-10: 300 mg via ORAL

## 2019-03-10 MED ORDER — ACETAMINOPHEN 325 MG PO TABS: 650.0000 mg | ORAL_TABLET | ORAL | Status: DC | PRN

## 2019-03-10 MED ORDER — FENTANYL CITRATE (PF) 100 MCG/2ML IJ SOLN
INTRAMUSCULAR | Status: DC | PRN
  Administered 2019-03-10: 25 ug via INTRAVENOUS
  Administered 2019-03-10: 50 ug via INTRAVENOUS

## 2019-03-10 MED ORDER — ROSUVASTATIN CALCIUM 10 MG PO TABS: 10.0000 mg | ORAL_TABLET | Freq: Every day | ORAL | Status: DC

## 2019-03-10 MED ORDER — SODIUM CHLORIDE 0.9 % IV SOLN
INTRAVENOUS | Status: DC
  Administered 2019-03-10: 09:00:00 via INTRAVENOUS

## 2019-03-10 MED ORDER — IODIXANOL 320 MG/ML IV SOLN
INTRAVENOUS | Status: DC | PRN
  Administered 2019-03-10: 150 mL

## 2019-03-10 MED ORDER — HYDRALAZINE HCL 20 MG/ML IJ SOLN: 5.0000 mg | INTRAMUSCULAR | Status: DC | PRN

## 2019-03-10 MED ORDER — HEPARIN SODIUM (PORCINE) 1000 UNIT/ML IJ SOLN
INTRAMUSCULAR | Status: DC | PRN
  Administered 2019-03-10: 8000 [IU] via INTRAVENOUS

## 2019-03-10 MED ORDER — ASPIRIN EC 81 MG PO TBEC: 81.0000 mg | DELAYED_RELEASE_TABLET | Freq: Every day | ORAL | Status: DC

## 2019-03-10 MED ORDER — CLOPIDOGREL BISULFATE 75 MG PO TABS: 300.0000 mg | ORAL_TABLET | Freq: Once | ORAL | Status: DC

## 2019-03-10 MED ORDER — HEPARIN SODIUM (PORCINE) 1000 UNIT/ML IJ SOLN
INTRAMUSCULAR | Status: AC
  Filled 2019-03-10: qty 1

## 2019-03-10 MED ORDER — HEPARIN (PORCINE) IN NACL 1000-0.9 UT/500ML-% IV SOLN
INTRAVENOUS | Status: AC
  Filled 2019-03-10: qty 1000

## 2019-03-10 MED ORDER — ROSUVASTATIN CALCIUM 10 MG PO TABS: 10.0000 mg | ORAL_TABLET | Freq: Every day | ORAL | 11 refills | Status: DC

## 2019-03-10 MED ORDER — LABETALOL HCL 5 MG/ML IV SOLN: 10.0000 mg | INTRAVENOUS | Status: DC | PRN

## 2019-03-10 MED ORDER — MIDAZOLAM HCL 2 MG/2ML IJ SOLN
INTRAMUSCULAR | Status: DC | PRN
  Administered 2019-03-10 (×2): 1 mg via INTRAVENOUS

## 2019-03-10 MED ORDER — HEPARIN (PORCINE) IN NACL 1000-0.9 UT/500ML-% IV SOLN
INTRAVENOUS | Status: DC | PRN
  Administered 2019-03-10 (×2): 500 mL

## 2019-03-10 MED ORDER — MIDAZOLAM HCL 2 MG/2ML IJ SOLN
INTRAMUSCULAR | Status: AC
  Filled 2019-03-10: qty 2

## 2019-03-10 MED ORDER — OXYCODONE HCL 5 MG PO TABS: 5.0000 mg | ORAL_TABLET | ORAL | Status: DC | PRN

## 2019-03-10 MED ORDER — SODIUM CHLORIDE 0.9 % WEIGHT BASED INFUSION: 1.0000 mL/kg/h | INTRAVENOUS | Status: DC

## 2019-03-10 MED ORDER — SODIUM CHLORIDE 0.9 % IV SOLN: 250.0000 mL | INTRAVENOUS | Status: DC | PRN

## 2019-03-10 MED ORDER — ONDANSETRON HCL 4 MG/2ML IJ SOLN: 4.0000 mg | Freq: Four times a day (QID) | INTRAMUSCULAR | Status: DC | PRN

## 2019-03-10 MED ORDER — CLOPIDOGREL BISULFATE 75 MG PO TABS: 75.0000 mg | ORAL_TABLET | Freq: Every day | ORAL | 11 refills | Status: DC

## 2019-03-10 MED ORDER — SODIUM CHLORIDE 0.9% FLUSH: 3.0000 mL | Freq: Two times a day (BID) | INTRAVENOUS | Status: DC

## 2019-03-10 MED ORDER — CLOPIDOGREL BISULFATE 75 MG PO TABS: 75.0000 mg | ORAL_TABLET | Freq: Every day | ORAL | Status: DC

## 2019-03-10 MED ORDER — SODIUM CHLORIDE 0.9% FLUSH: 3.0000 mL | INTRAVENOUS | Status: DC | PRN

## 2019-03-10 MED FILL — ROSUVASTATIN CALCIUM 10 MG: 10 | 30 days supply | Qty: 30 | Fill #0

## 2019-03-10 MED FILL — CLOPIDOGREL 75 MG TABLET: 75 | 30 days supply | Qty: 30 | Fill #0

## 2019-03-10 SURGICAL SUPPLY — 25 items
BALLN ADMIRAL INPACT 5X250 (BALLOONS) ×3
BALLN STERLING OTW 5X220X150 (BALLOONS) ×3
BALLOON ADMIRAL INPACT 5X250 (BALLOONS) ×2 IMPLANT
BALLOON STERLING OTW 5X220X150 (BALLOONS) ×2 IMPLANT
BUR JETSTREAM XC 2.4/3.4 (BURR) ×2 IMPLANT
BURR JETSTREAM XC 2.4/3.4 (BURR) ×3
CATH OMNI FLUSH 5F 65CM (CATHETERS) ×3 IMPLANT
CATH QUICKCROSS SUPP .035X90CM (MICROCATHETER) ×3 IMPLANT
CLOSURE MYNX CONTROL 6F/7F (Vascular Products) ×3 IMPLANT
DEVICE EMBOSHIELD NAV6 4.0-7.0 (FILTER) ×3 IMPLANT
DEVICE TORQUE .025-.038 (MISCELLANEOUS) ×3 IMPLANT
GUIDEWIRE ANGLED .035X260CM (WIRE) ×3 IMPLANT
KIT ENCORE 26 ADVANTAGE (KITS) ×3 IMPLANT
KIT MICROPUNCTURE NIT STIFF (SHEATH) ×3 IMPLANT
KIT PV (KITS) ×3 IMPLANT
SHEATH FLEXOR ANSEL 1 7F 45CM (SHEATH) ×3 IMPLANT
SHEATH PINNACLE 5F 10CM (SHEATH) ×3 IMPLANT
SHEATH PINNACLE 7F 10CM (SHEATH) ×3 IMPLANT
SHEATH PROBE COVER 6X72 (BAG) ×3 IMPLANT
SYR MEDRAD MARK V 150ML (SYRINGE) ×3 IMPLANT
TRANSDUCER W/STOPCOCK (MISCELLANEOUS) ×3 IMPLANT
TRAY PV CATH (CUSTOM PROCEDURE TRAY) ×3 IMPLANT
WIRE BAREWIRE WORK .014X315CM (WIRE) ×3 IMPLANT
WIRE BENTSON .035X145CM (WIRE) ×3 IMPLANT
WIRE ROSEN-J .035X260CM (WIRE) ×3 IMPLANT

## 2019-03-10 NOTE — H&P (Signed)
   History and Physical Update  The patient was interviewed and re-examined.  The patient's previous History and Physical has been reviewed and is unchanged from recent office visit.  Plan will be for aortogram bilateral lower extremity runoff right common femoral approach.  No major changes since previous evaluation.  Ericia Moxley C. Donzetta Matters, MD [facility] [place] Office: [phone] Pager: [phone]   03/10/2019, 8:39 AM

## 2019-03-10 NOTE — Progress Notes (Signed)
No bleeding or swelling noted after ambulation 

## 2019-03-10 NOTE — Op Note (Signed)
    Patient name: Martin Keller MRN: [id number] DOB: 11/13/1965 Sex: male  03/10/2019 Pre-operative Diagnosis: Bilateral lower extremity pain with moderately depressed ABIs Post-operative diagnosis:  Same Surgeon:  Eda Paschal. Donzetta Matters, MD Procedure Performed: 1.  Ultrasound-guided cannulation right common femoral artery 2.  Aortogram with bilateral lower extremity runoff 3.  Jetstream atherectomy left SFA with filter distal embolic protection 4.  Drug-coated balloon angioplasty with 5 x 250 mm in.pact left SFA 5.  Mynx device closure right common femoral artery 6.  Moderate sedation with fentanyl and Versed for 59 minutes  Indications: 53 year old male with history of smoking.  He has bilateral lower extremity rest pain with moderately depressed ABIs.  Pain is on the left greater than right.  He is indicated for angiography possible intervention.  Findings: Aorta and iliac segments are free of flow-limiting stenosis.  On the right side SFA is initially patent comfortable does not appear to have any disease.  SFA then occludes for at least approximately 10 cm reconstitutes above the knee.  He appears to have flow via anterior tibial peroneal arteries after that.  Left side which is the site of interest has similar lack of disease in the common femoral and proximal SFA.  SFA then has multiple areas of greater than 50% stenosis and at least one segment with approximately 90% stenosis over the course of 2 cm.  Following atherectomy balloon angioplasty demonstrated no residual stenosis and no dissection.  We then performed drug-coated balloon angioplasty nominal pressure for 3 minutes had no residual stenosis no dissection inflow was to the foot via anterior tibial and peroneal arteries.   Procedure:  The patient was identified in the holding area and taken to room 8.  The patient was then placed supine on the table and prepped and draped in the usual sterile fashion.  A time out was called.  Ultrasound was  used to evaluate the right common femoral artery.  This was free of disease.  There is anesthetized 1% lidocaine cannulated directly with micropuncture needle followed by wire sheath.  Images saved the permanent record.  5 French sheath was placed followed by Omni catheter to the level of L1.  Aortogram was performed followed by bilateral extremity runoff.  We then crossed the bifurcation with a Glidewire and Omni catheter used quick cross to go up and over and placed a Rosen wire.  We placed a 7 French sheath into the proximal left SFA.  Patient was fully heparinized.  We crossed the lesion with quick cross catheter and Glidewire confirmed intraluminal access.  A bare wire was placed followed by 4-7 mm Nav 6 filter.  We then performed jetstream atherectomy of both high and low settings of the entire SFA.  Follow this with 5 mm balloon angioplasty of the SFA.  There was no residual dissection.  We then performed drug-coated balloon angioplasty for 3 minutes at nominal pressure.  Completion demonstrated no residual stenosis or dissection.  We then removed our filter.  Completion angio was as above.  Satisfied we then exchanged for a short 7 French sheath in the right groin performed minx device closure.  He tolerated this well without immediate complication.  Contrast: 150 cc   Seraiah Nowack C. Donzetta Matters, MD [facility] [place] Office: [phone] Pager: [phone]

## 2019-03-10 NOTE — Discharge Instructions (Signed)
Femoral Site Care °This sheet gives you information about how to care for yourself after your procedure. Your health care provider may also give you more specific instructions. If you have problems or questions, contact your health care provider. °What can I expect after the procedure? °After the procedure, it is common to have: °· Bruising that usually fades within 1-2 weeks. °· Tenderness at the site. °Follow these instructions at home: °Wound care °· Follow instructions from your health care provider about how to take care of your insertion site. Make sure you: °? Wash your hands with soap and water before you change your bandage (dressing). If soap and water are not available, use hand sanitizer. °? Change your dressing as told by your health care provider. °? Leave stitches (sutures), skin glue, or adhesive strips in place. These skin closures may need to stay in place for 2 weeks or longer. If adhesive strip edges start to loosen and curl up, you may trim the loose edges. Do not remove adhesive strips completely unless your health care provider tells you to do that. °· Do not take baths, swim, or use a hot tub until your health care provider approves. °· You may shower 24-48 hours after the procedure or as told by your health care provider. °? Gently wash the site with plain soap and water. °? Pat the area dry with a clean towel. °? Do not rub the site. This may cause bleeding. °· Do not apply powder or lotion to the site. Keep the site clean and dry. °· Check your femoral site every day for signs of infection. Check for: °? Redness, swelling, or pain. °? Fluid or blood. °? Warmth. °? Pus or a bad smell. °Activity °· For the first 2-3 days after your procedure, or as long as directed: °? Avoid climbing stairs as much as possible. °? Do not squat. °· Do not lift anything that is heavier than 10 lb (4.5 kg), or the limit that you are told, until your health care provider says that it is safe. °· Rest as  directed. °? Avoid sitting for a long time without moving. Get up to take short walks every 1-2 hours. °· Do not drive for 24 hours if you were given a medicine to help you relax (sedative). °General instructions °· Take over-the-counter and prescription medicines only as told by your health care provider. °· Keep all follow-up visits as told by your health care provider. This is important. °Contact a health care provider if you have: °· A fever or chills. °· You have redness, swelling, or pain around your insertion site. °Get help right away if: °· The catheter insertion area swells very fast. °· You pass out. °· You suddenly start to sweat or your skin gets clammy. °· The catheter insertion area is bleeding, and the bleeding does not stop when you hold steady pressure on the area. °· The area near or just beyond the catheter insertion site becomes pale, cool, tingly, or numb. °These symptoms may represent a serious problem that is an emergency. Do not wait to see if the symptoms will go away. Get medical help right away. Call your local emergency services (911 in the [place] Do not drive yourself to the hospital. °Summary °· After the procedure, it is common to have bruising that usually fades within 1-2 weeks. °· Check your femoral site every day for signs of infection. °· Do not lift anything that is heavier than 10 lb (4.5 kg), or the   limit that you are told, until your health care provider says that it is safe. °This information is not intended to replace advice given to you by your health care provider. Make sure you discuss any questions you have with your health care provider. °Document Released: 11/21/2013 Document Revised: 04/02/2017 Document Reviewed: 04/02/2017 °Elsevier Patient Education © 2020 [employer] ° °

## 2019-04-14 ENCOUNTER — Encounter (HOSPITAL_COMMUNITY): Payer: Self-pay

## 2019-04-17 ENCOUNTER — Other Ambulatory Visit: Payer: Self-pay

## 2019-04-17 DIAGNOSIS — I739 Peripheral vascular disease, unspecified: Secondary | ICD-10-CM

## 2019-04-17 NOTE — Progress Notes (Deleted)
History of Present Illness:  Patient is a 54 y.o. year old male who presents for evaluation of claudication.  He has bilateral lower extremity rest pain with moderately depressed ABIs.  Pain is on the left greater than right.  He underwent angiogram with intervention 03/10/19 by Dr. Donzetta Matters.  S/P Jetstream atherectomy left SFA with filter distal embolic protection, Drug-coated balloon angioplasty with 5 x 250 mm in.pact left SFA with access right common femoral artery.    Past Medical History:  Diagnosis Date  . Hyperlipidemia   . Hypertension     Past Surgical History:  Procedure Laterality Date  . ABDOMINAL AORTOGRAM W/LOWER EXTREMITY N/A 03/10/2019   Procedure: ABDOMINAL AORTOGRAM W/LOWER EXTREMITY;  Surgeon: Waynetta Sandy, MD;  Location: [place] [facility];  Service: Cardiovascular;  Laterality: N/A;  . PERIPHERAL VASCULAR ATHERECTOMY Left 03/10/2019   Procedure: PERIPHERAL VASCULAR ATHERECTOMY;  Surgeon: Waynetta Sandy, MD;  Location: [place] [facility];  Service: Cardiovascular;  Laterality: Left;  SFA  . PERIPHERAL VASCULAR BALLOON ANGIOPLASTY Left 03/10/2019   Procedure: PERIPHERAL VASCULAR BALLOON ANGIOPLASTY;  Surgeon: Waynetta Sandy, MD;  Location: [place] [facility];  Service: Cardiovascular;  Laterality: Left;  SFA  . RIGHT/LEFT HEART CATH AND CORONARY ANGIOGRAPHY N/A 03/05/2018   Procedure: RIGHT/LEFT HEART CATH AND CORONARY ANGIOGRAPHY;  Surgeon: Jolaine Artist, MD;  Location: [place] [facility];  Service: Cardiovascular;  Laterality: N/A;  . TEE WITHOUT CARDIOVERSION N/A 03/07/2018   Procedure: TRANSESOPHAGEAL ECHOCARDIOGRAM (TEE);  Surgeon: Larey Dresser, MD;  Location: [facility] ENDOSCOPY;  Service: Cardiovascular;  Laterality: N/A;    ROS:   General:  No weight loss, Fever, chills  HEENT: No recent headaches, no nasal bleeding, no visual changes, no sore throat  Neurologic: No dizziness, blackouts, seizures. No recent symptoms of stroke  or mini- stroke. No recent episodes of slurred speech, or temporary blindness.  Cardiac: No recent episodes of chest pain/pressure, no shortness of breath at rest.  No shortness of breath with exertion.  Denies history of atrial fibrillation or irregular heartbeat  Vascular: No history of rest pain in feet.  No history of claudication.  No history of non-healing ulcer, No history of DVT   Pulmonary: No home oxygen, no productive cough, no hemoptysis,  No asthma or wheezing  Musculoskeletal:  [ ]  Arthritis, [ ]  Low back pain,  [ ]  Joint pain  Hematologic:No history of hypercoagulable state.  No history of easy bleeding.  No history of anemia  Gastrointestinal: No hematochezia or melena,  No gastroesophageal reflux, no trouble swallowing  Urinary: [ ]  chronic Kidney disease, [ ]  on HD - [ ]  MWF or [ ]  TTHS, [ ]  Burning with urination, [ ]  Frequent urination, [ ]  Difficulty urinating;   Skin: No rashes  Psychological: No history of anxiety,  No history of depression  Social History Social History   Tobacco Use  . Smoking status: Current Every Day Smoker    Types: Cigars  . Smokeless tobacco: Never Used  Substance Use Topics  . Alcohol use: Yes  . Drug use: Yes    Types: Marijuana    Family History Family History  Problem Relation Age of Onset  . Heart attack Mother        Died at age of 75 from heart attack.  Marland Kitchen Heart attack Father        Died at age of 27 from heart attack.  Marland Kitchen Heart disease Brother  Heart surgery at age of 12.  . Diabetes Child     Allergies  No Known Allergies   Current Outpatient Medications  Medication Sig Dispense Refill  . carvedilol (COREG) 3.125 MG tablet Take 1 tablet (3.125 mg total) by mouth 2 (two) times daily with a meal. 60 tablet 2  . clopidogrel (PLAVIX) 75 MG tablet Take 1 tablet (75 mg total) by mouth daily. 30 tablet 11  . furosemide (LASIX) 40 MG tablet Take 1 tablet (40 mg total) by mouth daily. (Patient taking differently:  Take 40 mg by mouth daily in the afternoon. ) 30 tablet 1  . Multiple Vitamin (MULTIVITAMIN WITH MINERALS) TABS tablet Take 1 tablet by mouth daily.    . rosuvastatin (CRESTOR) 10 MG tablet Take 1 tablet (10 mg total) by mouth at bedtime. 30 tablet 11  . sacubitril-valsartan (ENTRESTO) 49-51 MG Take 1 tablet by mouth 2 (two) times daily. 60 tablet 3  . spironolactone (ALDACTONE) 25 MG tablet Take 1 tablet (25 mg total) by mouth daily. (Patient taking differently: Take 25 mg by mouth daily in the afternoon. ) 30 tablet 1   No current facility-administered medications for this visit.    Physical Examination  There were no vitals filed for this visit.  There is no height or weight on file to calculate BMI.  General:  Alert and oriented, no acute distress HEENT: Normal Neck: No bruit or JVD Pulmonary: Clear to auscultation bilaterally Cardiac: Regular Rate and Rhythm without murmur Abdomen: Soft, non-tender, non-distended, no mass, no scars Skin: No rash Extremity Pulses:  2+ radial, brachial, femoral, dorsalis pedis, posterior tibial pulses bilaterally Musculoskeletal: No deformity or edema  Neurologic: Upper and lower extremity motor 5/5 and symmetric  DATA: ***   ASSESSMENT: ***   PLAN: ***   Mosetta Pigeon PA-C [facility] Office: [phone]  MD in clinic Kevin

## 2019-04-18 ENCOUNTER — Ambulatory Visit (HOSPITAL_COMMUNITY): Admit: 2019-04-18 | Payer: Self-pay

## 2019-04-18 ENCOUNTER — Encounter (HOSPITAL_COMMUNITY): Payer: Self-pay

## 2019-04-24 MED FILL — CLOPIDOGREL 75 MG TABLET: 75 | 30 days supply | Qty: 30 | Fill #1

## 2019-04-24 MED FILL — ROSUVASTATIN CALCIUM 10 MG: 10 | 30 days supply | Qty: 30 | Fill #1

## 2019-04-24 MED FILL — FUROSEMIDE 40 MG TAB: 40 | 30 days supply | Qty: 30 | Fill #1

## 2019-04-24 MED FILL — SPIRONOLACTONE 25 MG TABS: 25 | 30 days supply | Qty: 30 | Fill #1

## 2019-05-01 ENCOUNTER — Encounter (HOSPITAL_COMMUNITY): Payer: Self-pay

## 2019-05-01 ENCOUNTER — Telehealth (HOSPITAL_COMMUNITY): Payer: Self-pay | Admitting: Vascular Surgery

## 2019-05-01 NOTE — Telephone Encounter (Signed)
PT lvm to cancel 05/01/19 appt @ 2:30PM, PT stated he had death in thE family , I returned pt call to reschedule appt  pt does not have VM set to leave message will try pt at later time to reschedule appt

## 2019-05-07 ENCOUNTER — Encounter: Payer: Self-pay | Admitting: Surgery

## 2019-06-05 ENCOUNTER — Other Ambulatory Visit (HOSPITAL_COMMUNITY): Payer: Self-pay | Admitting: Adult Health

## 2019-06-05 MED FILL — FUROSEMIDE 40 MG TAB: 40 | 30 days supply | Qty: 30 | Fill #0

## 2019-11-28 ENCOUNTER — Ambulatory Visit (INDEPENDENT_AMBULATORY_CARE_PROVIDER_SITE_OTHER)
Admission: RE | Admit: 2019-11-28 | Discharge: 2019-11-28 | Disposition: A | Discharge status: Home / Self Care | Payer: Medicaid Other | Source: Ambulatory Visit | Attending: Vascular Surgery | Admitting: Vascular Surgery

## 2019-11-28 ENCOUNTER — Ambulatory Visit (HOSPITAL_COMMUNITY)
Admission: RE | Admit: 2019-11-28 | Discharge: 2019-11-28 | Disposition: A | Discharge status: Home / Self Care | Payer: Medicaid Other | Source: Ambulatory Visit | Attending: Vascular Surgery | Admitting: Vascular Surgery

## 2019-11-28 ENCOUNTER — Other Ambulatory Visit: Payer: Self-pay

## 2019-11-28 ENCOUNTER — Ambulatory Visit (INDEPENDENT_AMBULATORY_CARE_PROVIDER_SITE_OTHER): Payer: Self-pay | Admitting: Physician Assistant

## 2019-11-28 VITALS — BP 123/77 | HR 60 | Temp 98.2°F | Resp 18 | Ht 70.0 in | Wt 159.0 lb

## 2019-11-28 DIAGNOSIS — I739 Peripheral vascular disease, unspecified: Secondary | ICD-10-CM

## 2019-11-28 NOTE — Progress Notes (Signed)
HISTORY AND PHYSICAL     CC:  follow up. Requesting Provider:  Vicki Mallet*  HPI: This is a 54 y.o. male who is here today for follow up for PAD.  He has hx of bilateral lower extremity pain.  He does have hyperlipidemia, hypertension.  He underwent aortogram with atherectomy of left SFA, drug coated balloon angioplasty in the left SFA on 03/09/2020 by Dr. Randie Heinz for bilateral rest pain with moderately depressed ABI's with the left worse than right.    The pt returns today for follow up. He states that the pain in his left leg has improved.  He states that the pain in the right leg has also improved.  He does not have any non healing wounds.  He states that he was in a medication program and he has not been able to get his medicines.  He is not taking his Plavix.  He is followed by Dr. Gala Romney for heart failure.  Pt states he gets short of breath at night when he lays flat.  He states this doesn't happen every night.  He states when he props up on pillows, this improves.  He states he has several pillows that he uses but uses one to prop on. Difficult to determine just how many pillows for his orthopnea that he needs.   He also is not taking his CHF meds.   The pt is on a statin for cholesterol management.    The pt is not on an aspirin.    Other AC:  Plavix The pt is on ARB, BB for hypertension.  The pt does not have diabetes. Tobacco hx:  current  (he is not taking his medications-states he was in a program and can no longer get them)  Pt does not have family hx of AAA.  Past Medical History:  Diagnosis Date  . Hyperlipidemia   . Hypertension     Past Surgical History:  Procedure Laterality Date  . ABDOMINAL AORTOGRAM W/LOWER EXTREMITY N/A 03/10/2019   Procedure: ABDOMINAL AORTOGRAM W/LOWER EXTREMITY;  Surgeon: Maeola Harman, MD;  [facility]: [facility];  Service: Cardiovascular;  Laterality: N/A;  . PERIPHERAL VASCULAR ATHERECTOMY Left 03/10/2019    Procedure: PERIPHERAL VASCULAR ATHERECTOMY;  Surgeon: Maeola Harman, MD;  Location: [facility];  Service: Cardiovascular;  Laterality: Left;  SFA  . PERIPHERAL VASCULAR BALLOON ANGIOPLASTY Left 03/10/2019   Procedure: PERIPHERAL VASCULAR BALLOON ANGIOPLASTY;  Surgeon: Maeola Harman, MD;  Location: [facility] INVASIVE [facility];  Service: Cardiovascular;  Laterality: Left;  SFA  . RIGHT/LEFT HEART CATH AND CORONARY ANGIOGRAPHY N/A 03/05/2018   Procedure: RIGHT/LEFT HEART CATH AND CORONARY ANGIOGRAPHY;  Surgeon: Dolores Patty, MD;  Location: [facility];  Service: Cardiovascular;  Laterality: N/A;  . TEE WITHOUT CARDIOVERSION N/A 03/07/2018   Procedure: TRANSESOPHAGEAL ECHOCARDIOGRAM (TEE);  Surgeon: Laurey Morale, MD;  Location: [facility] ENDOSCOPY;  Service: Cardiovascular;  Laterality: N/A;    No Known Allergies  Current Outpatient Medications  Medication Sig Dispense Refill  . carvedilol (COREG) 3.125 MG tablet Take 1 tablet (3.125 mg total) by mouth 2 (two) times daily with a meal. 60 tablet 2  . clopidogrel (PLAVIX) 75 MG tablet Take 1 tablet (75 mg total) by mouth daily. 30 tablet 11  . furosemide (LASIX) 40 MG tablet TAKE 1 TABLET (40 MG TOTAL) BY MOUTH DAILY. 30 tablet 1  . Multiple Vitamin (MULTIVITAMIN WITH MINERALS) TABS tablet Take 1 tablet by mouth daily.    Marland Kitchen  rosuvastatin (CRESTOR) 10 MG tablet Take 1 tablet (10 mg total) by mouth at bedtime. 30 tablet 11  . sacubitril-valsartan (ENTRESTO) 49-51 MG Take 1 tablet by mouth 2 (two) times daily. 60 tablet 3  . spironolactone (ALDACTONE) 25 MG tablet Take 1 tablet (25 mg total) by mouth daily. (Patient taking differently: Take 25 mg by mouth daily in the afternoon. ) 30 tablet 1   No current facility-administered medications for this visit.    Family History  Problem Relation Age of Onset  . Heart attack Mother        Died at age of 14 from heart attack.  Marland Kitchen Heart attack Father        Died at age of 46  from heart attack.  Marland Kitchen Heart disease Brother        Heart surgery at age of 17.  . Diabetes Child     Social History   Socioeconomic History  . Marital status: Divorced    Spouse name: Not on file  . Number of children: Not on file  . Years of education: Not on file  . Highest education level: Not on file  Occupational History  . Not on file  Tobacco Use  . Smoking status: Current Every Day Smoker    Types: Cigars  . Smokeless tobacco: Never Used  Vaping Use  . Vaping Use: Never used  Substance and Sexual Activity  . Alcohol use: Yes  . Drug use: Yes    Types: Marijuana  . Sexual activity: Yes    Partners: Female    Birth control/protection: None  Other Topics Concern  . Not on file  Social History Narrative  . Not on file   Social Determinants of Health   Financial Resource Strain:   . Difficulty of Paying Living Expenses: Not on file  Food Insecurity:   . Worried About Programme researcher, broadcasting/film/video in the Last Year: Not on file  . Ran Out of Food in the Last Year: Not on file  Transportation Needs:   . Lack of Transportation (Medical): Not on file  . Lack of Transportation (Non-Medical): Not on file  Physical Activity:   . Days of Exercise per Week: Not on file  . Minutes of Exercise per Session: Not on file  Stress:   . Feeling of Stress : Not on file  Social Connections:   . Frequency of Communication with Friends and Family: Not on file  . Frequency of Social Gatherings with Friends and Family: Not on file  . Attends Religious Services: Not on file  . Active Member of Clubs or Organizations: Not on file  . Attends Banker Meetings: Not on file  . Marital Status: Not on file  Intimate Partner Violence:   . Fear of Current or Ex-Partner: Not on file  . Emotionally Abused: Not on file  . Physically Abused: Not on file  . Sexually Abused: Not on file     REVIEW OF SYSTEMS:   [X]  denotes positive finding, [ ]  denotes negative finding Cardiac   Comments:  Chest pain or chest pressure:    Shortness of breath upon exertion:    Short of breath when lying flat: x   Irregular heart rhythm:        Vascular    Pain in calf, thigh, or hip brought on by ambulation:    Pain in feet at night that wakes you up from your sleep:     Blood clot in your veins:  Leg swelling:  x Left foot for about a year      Pulmonary    Oxygen at home:    Productive cough:     Wheezing:         Neurologic    Sudden weakness in arms or legs:     Sudden numbness in arms or legs:     Sudden onset of difficulty speaking or slurred speech:    Temporary loss of vision in one eye:     Problems with dizziness:         Gastrointestinal    Blood in stool:     Vomited blood:         Genitourinary    Burning when urinating:     Blood in urine:        Psychiatric    Major depression:         Hematologic    Bleeding problems:    Problems with blood clotting too easily:        Skin    Rashes or ulcers:        Constitutional    Fever or chills:      PHYSICAL EXAMINATION:  Today's Vitals   11/28/19 1108  BP: 123/77  Pulse: 60  Resp: 18  Temp: 98.2 F (36.8 C)  TempSrc: Temporal  SpO2: 99%  Weight: 159 lb (72.1 kg)  Height: 5\' 10"  (1.778 m)   Body mass index is 22.81 kg/m.   General:  WDWN in NAD; vital signs documented above Gait: Not observed HENT: WNL, normocephalic Pulmonary: normal non-labored breathing , without wheezing Cardiac: regular HR, without  Murmur; without carotid bruits Abdomen: soft, NT, no masses Skin: without rashes Vascular Exam/Pulses:  Right Left  Radial 2+ (normal) 2+ (normal)  Ulnar Unable to palpate Unable to palpate  Femoral 2+ (normal) 2+ (normal)  Popliteal Unable to palpate Unable to palpate  DP Unable to palpate 2+ (normal)  PT Unable to palpate Unable to palpate   Extremities: without ischemic changes, without Gangrene , without cellulitis; without open wounds;  Musculoskeletal: no muscle  wasting or atrophy  Neurologic: A&O X 3;  No focal weakness or paresthesias are detected Psychiatric:  The pt has Normal affect.   Non-Invasive Vascular Imaging:   ABI's/TBI's on 11/28/2019: Right:  0.64/0.6 - Great toe pressure: 79 (B)(dampened M) Left:  1.08/1.2 - Great toe pressure: 159 (T)(B)  Arterial duplex on 11/28/2019: 30-49% stenosis proximal SFA with triphasic waveforms throughout.  Previous ABI's/TBI's on 02/14/2020: Right:  0.53/0.46 - Great toe pressure: 62 Left:  0/64/0.58 - Great toe pressure:  78   ASSESSMENT/PLAN:: 54 y.o. male here for follow up for hx of bilateral lower extremity pain.  He does have hyperlipidemia, hypertension.  He underwent aortogram with atherectomy of left SFA, drug coated balloon angioplasty in the left SFA on 03/09/2020 by Dr. 14/10/2019 for bilateral rest pain with moderately depressed ABI's with the left worse than right.   -pt doing well with palpable left DP pulse. His right leg symptoms have improved.  He does have a 30-49% stenosis in the mid left SFA, however, it is patent with triphasic waveforms. -will continue to monitor this and have him return in 6 months for repeat studies.  -he has not been taking any of his medications.  I discussed with Dr. Randie Heinz and it is okay for pt to start taking baby asa.  I talked to pt about this and that he can get this at the drug store or  at the [facility] outpatient pharmacy for decreased price.   He also needs to be back on his statin. -pt with hx of CHF and followed by Dr. Gala Romney.  He is having 1-3 pillow orthopnea on some nights.  He has not been taking his Entresto.  Will have our office schedule follow up appt with him.   -discussed importance of smoking cessation with pt.    Doreatha Massed, [facility] [phone]  Clinic MD:   Randie Heinz

## 2019-12-01 NOTE — Progress Notes (Signed)
Cardiology Office Note:    Date:  12/02/2019   ID:  Martin Keller, DOB 1965/07/16, MRN [id number]  PCP:  Patient, No Pcp Per  Cardiologist:  Nanetta Batty, MD   Referring MD: No ref. provider found   Chief Complaint  Patient presents with  . Follow-up  acute on chronic systolic heart failure  History of Present Illness:    Martin Keller is a 54 y.o. male with a hx of PAD, HLD, HTN, tobacco use, chronic systolic heart failure, and severe MR (diagnosed 12/19). PAD followed by Dr. Randie Heinz and is s/p drug coated balloon angioplasty to left SFA (03/10/19). He is followed by Dr. Gala Romney in Advanced Heart Failure clinic. He was diagnosed with CHF in 2019 with EF of 30-35% and severe MR. Follow up right and left heart cath showed severe NICM with EF 20-25% and confirmed severe 3-4+ MR. TEE showed severe functional MR. Medications were optimized and follow up echo 11/2018 showed EF of 30-35% and moderate MR. He was last seen in AHF clinic on 02/11/19 and was maintained on low dose BB, ARB, spironolactone, and lasix. He received medications through HF fund (uninsured). Dr. Gala Romney attempted to switch him to 49-51 mg entresto BID. He has declined ICD in the past.   Pt complained of dyspnea and orthopnea at [facility] clinic visit and was placed on my schedule. He presents today for follow up. He has not picked up any of his medications in 2 months and reports 1 month of orthopnea. He denies dyspnea during the day and LEE. Does admit SOB with exertion. He does not weigh daily, but denies weight changes. With more discussion, he admits to being out of his medications for closer to 4 months. He also has some problems getting to [place] to the pharmacy. Will investigate possibly getting 3 month supplies.  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 He has been doing this without major dyspnea.    Past Medical History:  Diagnosis Date  . Hyperlipidemia   . Hypertension     Past Surgical History:  Procedure  Laterality Date  . ABDOMINAL AORTOGRAM W/LOWER EXTREMITY N/A 03/10/2019   Procedure: ABDOMINAL AORTOGRAM W/LOWER EXTREMITY;  Surgeon: Maeola Harman, MD;  Location: [facility] INVASIVE CV LAB;  Service: Cardiovascular;  Laterality: N/A;  . PERIPHERAL VASCULAR ATHERECTOMY Left 03/10/2019   Procedure: PERIPHERAL VASCULAR ATHERECTOMY;  Surgeon: Maeola Harman, MD;  Location: [facility] INVASIVE CV LAB;  Service: Cardiovascular;  Laterality: Left;  SFA  . PERIPHERAL VASCULAR BALLOON ANGIOPLASTY Left 03/10/2019   Procedure: PERIPHERAL VASCULAR BALLOON ANGIOPLASTY;  Surgeon: Maeola Harman, MD;  Location: [facility] INVASIVE CV LAB;  Service: Cardiovascular;  Laterality: Left;  SFA  . RIGHT/LEFT HEART CATH AND CORONARY ANGIOGRAPHY N/A 03/05/2018   Procedure: RIGHT/LEFT HEART CATH AND CORONARY ANGIOGRAPHY;  Surgeon: Dolores Patty, MD;  Location: [facility] INVASIVE CV LAB;  Service: Cardiovascular;  Laterality: N/A;  . TEE WITHOUT CARDIOVERSION N/A 03/07/2018   Procedure: TRANSESOPHAGEAL ECHOCARDIOGRAM (TEE);  Surgeon: Laurey Morale, MD;  Location: [facility] ENDOSCOPY;  Service: Cardiovascular;  Laterality: N/A;    Current Medications: Current Meds  Medication Sig  . carvedilol (COREG) 3.125 MG tablet Take 1 tablet (3.125 mg total) by mouth 2 (two) times daily with a meal.  . furosemide (LASIX) 40 MG tablet TAKE 1 TABLET (40 MG TOTAL) BY MOUTH DAILY.  . Multiple Vitamin (MULTIVITAMIN WITH MINERALS) TABS tablet Take 1 tablet by mouth daily.   . rosuvastatin (CRESTOR) 10 MG tablet Take 1 tablet (10  mg total) by mouth at bedtime.  . sacubitril-valsartan (ENTRESTO) 49-51 MG Take 1 tablet by mouth 2 (two) times daily.  Marland Kitchen spironolactone (ALDACTONE) 25 MG tablet Take 1 tablet (25 mg total) by mouth daily.  . [DISCONTINUED] clopidogrel (PLAVIX) 75 MG tablet Take 1 tablet (75 mg total) by mouth daily.     Allergies:   Patient has no known allergies.   Social History   Socioeconomic History  . Marital  status: Divorced    Spouse name: Not on file  . Number of children: Not on file  . Years of education: Not on file  . Highest education level: Not on file  Occupational History  . Not on file  Tobacco Use  . Smoking status: Current Every Day Smoker    Types: Cigars  . Smokeless tobacco: Never Used  Vaping Use  . Vaping Use: Never used  Substance and Sexual Activity  . Alcohol use: Yes  . Drug use: Yes    Types: Marijuana  . Sexual activity: Yes    Partners: Female    Birth control/protection: None  Other Topics Concern  . Not on file  Social History Narrative  . Not on file   Social Determinants of Health   Financial Resource Strain:   . Difficulty of Paying Living Expenses: Not on file  Food Insecurity:   . Worried About Programme researcher, broadcasting/film/video in the Last Year: Not on file  . Ran Out of Food in the Last Year: Not on file  Transportation Needs:   . Lack of Transportation (Medical): Not on file  . Lack of Transportation (Non-Medical): Not on file  Physical Activity:   . Days of Exercise per Week: Not on file  . Minutes of Exercise per Session: Not on file  Stress:   . Feeling of Stress : Not on file  Social Connections:   . Frequency of Communication with Friends and Family: Not on file  . Frequency of Social Gatherings with Friends and Family: Not on file  . Attends Religious Services: Not on file  . Active Member of Clubs or Organizations: Not on file  . Attends Banker Meetings: Not on file  . Marital Status: Not on file     Family History: The patient's family history includes Diabetes in his child; Heart attack in his father and mother; Heart disease in his brother.  ROS:   Please see the history of present illness.     All other systems reviewed and are negative.  EKGs/Labs/Other Studies Reviewed:    The following studies were reviewed today:  Echo 11/19/18: 1. The left ventricle has severely reduced systolic function, with an  ejection  fraction of 25-30% but in other views appears 30-35%. The cavity  size was severely dilated. Left ventricular diastolic function could not  be evaluated due to indeterminate  diastolic function. LV filling pressures not assessed. Left ventricular  diffuse hypokinesis.  2. The right ventricle has mildly reduced systolic function. The cavity  was normal. There is no increase in right ventricular wall thickness.  Right ventricular systolic pressure could not be assessed  3. Left atrial size was mildly dilated.  4. Mild thickening of the mitral valve leaflet. There is mild to moderate  mitral annular calcification present. Mitral valve regurgitation is  moderate by color flow Doppler. MR by PISA analysis is consistent with  mild to moderate MR ( PISA ERO  0.16cm2/MR RV 32cc) and visually moderate MR  5. The aortic valve  is tricuspid. Mild sclerosis of the aortic valve.  6. The aorta is normal unless otherwise noted.  7. The interatrial septum appears to be lipomatous.  8. Compared to prior echo: MR by PISA analysis is consistent with mild to  moderate MR ( PISA ERO 0.16cm2/MR RV 32cc) and visually moderate MR but  noted to be severe on last echo (ERO 0.32cm2/MR RV 51cc).   EKG:  EKG is ordered today.  The ekg ordered today demonstrates sinus rhythm HR 86, LVH  Recent Labs: 02/11/2019: B Natriuretic Peptide 128.7 03/10/2019: BUN 10; Creatinine, Ser 1.10; Hemoglobin 14.3; Potassium 5.2; Sodium 140  Recent Lipid Panel    Component Value Date/Time   CHOL 169 03/03/2018 1218   TRIG 91 03/03/2018 1218   HDL 33 (L) 03/03/2018 1218   CHOLHDL 5.1 03/03/2018 1218   VLDL 18 03/03/2018 1218   LDLCALC 118 (H) 03/03/2018 1218    Physical Exam:    VS:  BP (!) 146/98   Pulse 86   Ht 5\' 10"  (1.778 m)   Wt 158 lb 9.6 oz (71.9 kg)   SpO2 97%   BMI 22.76 kg/m     Wt Readings from Last 3 Encounters:  12/02/19 158 lb 9.6 oz (71.9 kg)  11/28/19 159 lb (72.1 kg)  03/10/19 170 lb (77.1 kg)      GEN: Well nourished, well developed in no acute distress HEENT: Normal NECK: mild JVD LYMPHATICS: No lymphadenopathy CARDIAC: RRR, MR murmur, ?S3 RESPIRATORY:  Clear to auscultation without rales, wheezing or rhonchi  ABDOMEN: Soft, non-tender, non-distended MUSCULOSKELETAL:  No edema; No deformity  SKIN: Warm and dry NEUROLOGIC:  Alert and oriented x 3 PSYCHIATRIC:  Normal affect   ASSESSMENT:    1. Chronic systolic heart failure (HCC)   2. PAD (peripheral artery disease) (HCC)   3. Severe mitral regurgitation   4. Essential hypertension   5. Noncompliance with medication regimen    PLAN:    In order of problems listed above:  Acute on chronic systolic heart failure NICM Medication noncompliance - pt presents with worsening dyspnea on exertion and orthopnea - dry weight thought to be 155 lbs - he does not weigh daily but denies weight changes - he has not taken medications in 4 months - he looks surprisingly good for being without medications for so long: no crackles and only mild JVP, but S3 on exam - because he looks so good clinically, I hesitate to restart all of his medications at once - Martin Keller was able to help 14/07/20 get in touch with [place] to rein[facility] - we gave him samples of 49-51 entresto and 81 mg ASA today - he will start these tonight - once he obtains carvedilol and spironolactone, he will add these to his regimen - if he continues to have orthopnea after 3-4 days on the above regimen, he will add lasix - with the above complex directions, I would prefer he have close follow up with the AHF clinic or me, but he started a new job and doesn't want to take off work again for 3-4 weeks - he will call Annice Pih if he has any worsening symptoms or problems obtaining medications - will collect BMP today   Moderate to severe functional MR - improved on last echo in 2020   HTN - medications as above   HLD - continue  crestor 10 mg   PAD - followed by Dr. 2021 - they have switched him from  plavix to ASA   COVID vaccination - he requests guidance on whether he should get a vaccination - we had a long discussion on risks/benefits of both infection and vaccination - I encouraged him to restart his heart failure medications first and then seek vaccination when he is no longer having CHF symptoms   Follow up in 3-4 weeks.   Medication Adjustments/Labs and Tests Ordered: Current medicines are reviewed at length with the patient today.  Concerns regarding medicines are outlined above.  Orders Placed This Encounter  Procedures  . Basic metabolic panel  . EKG 12-Lead   Meds ordered this encounter  Medications  . aspirin EC 81 MG tablet    Sig: Take 1 tablet (81 mg total) by mouth daily. Swallow whole.    Dispense:  90 tablet    Refill:  3    Signed, Marcelino Duster, [place]  12/02/2019 5:01 PM    [facility]

## 2019-12-02 ENCOUNTER — Other Ambulatory Visit: Payer: Self-pay | Admitting: *Deleted

## 2019-12-02 ENCOUNTER — Encounter: Payer: Self-pay | Admitting: Physician Assistant

## 2019-12-02 ENCOUNTER — Other Ambulatory Visit: Payer: Self-pay

## 2019-12-02 ENCOUNTER — Ambulatory Visit (INDEPENDENT_AMBULATORY_CARE_PROVIDER_SITE_OTHER): Payer: Self-pay | Admitting: Physician Assistant

## 2019-12-02 VITALS — BP 146/98 | HR 86 | Ht 70.0 in | Wt 158.6 lb

## 2019-12-02 DIAGNOSIS — Z9114 Patient's other noncompliance with medication regimen: Secondary | ICD-10-CM

## 2019-12-02 DIAGNOSIS — I739 Peripheral vascular disease, unspecified: Secondary | ICD-10-CM

## 2019-12-02 DIAGNOSIS — I340 Nonrheumatic mitral (valve) insufficiency: Secondary | ICD-10-CM

## 2019-12-02 DIAGNOSIS — I5022 Chronic systolic (congestive) heart failure: Secondary | ICD-10-CM

## 2019-12-02 DIAGNOSIS — I10 Essential (primary) hypertension: Secondary | ICD-10-CM

## 2019-12-02 MED ORDER — ASPIRIN EC 81 MG PO TBEC: 81.0000 mg | DELAYED_RELEASE_TABLET | Freq: Every day | ORAL | 3 refills | Status: DC

## 2019-12-02 NOTE — Patient Instructions (Addendum)
Medication Instructions:  RESTART the Aspirin 81 mg once daily and  Entresto 49/51 mg twice daily today.  RESTART the Spironolactone 25 mg daily and Carvedilol 3.125 mg twice daily after you hear from [place].  TAKE the Furosemide only if you develop shortness of breath when lying down.   STOP the Plavix  *If you need a refill on your cardiac medications before your next appointment, please call your pharmacy*   Lab Work: Your provider would like for you to have the following labs today: BMET  If you have labs (blood work) drawn today and your tests are completely normal, you will receive your results only by: Marland Kitchen MyChart Message (if you have MyChart) OR . A paper copy in the mail If you have any lab test that is abnormal or we need to change your treatment, we will call you to review the results.   Testing/Procedures: None ordered   Follow-Up: At [facility], you and your health needs are our priority.  As part of our continuing mission to provide you with exceptional heart care, we have created designated Provider Care Teams.  These Care Teams include your primary Cardiologist (physician) and Advanced Practice Providers (APPs -  Physician Assistants and Nurse Practitioners) who all work together to provide you with the care you need, when you need it.  We recommend signing up for the patient portal called "MyChart".  Sign up information is provided on this After Visit Summary.  MyChart is used to connect with patients for Virtual Visits (Telemedicine).  Patients are able to view lab/test results, encounter notes, upcoming appointments, etc.  Non-urgent messages can be sent to your provider as well.   To learn more about what you can do with MyChart, go to [email or web address].    Your next appointment:   1 month(s)  The format for your next appointment:   In Person  Provider:   Dr. Gala Romney

## 2019-12-03 ENCOUNTER — Telehealth: Payer: Self-pay | Admitting: Licensed Clinical Social Worker

## 2019-12-03 LAB — BASIC METABOLIC PANEL
BUN/Creatinine Ratio: 12 (ref 9–20)
BUN: 15 mg/dL (ref 6–24)
CO2: 21 mmol/L (ref 20–29)
Calcium: 9.9 mg/dL (ref 8.7–10.2)
Chloride: 109 mmol/L — ABNORMAL HIGH (ref 96–106)
Creatinine, Ser: 1.24 mg/dL (ref 0.76–1.27)
GFR calc Af Amer: 75 mL/min/{1.73_m2} (ref >59)
GFR calc non Af Amer: 65 mL/min/{1.73_m2} (ref >59)
Glucose: 98 mg/dL (ref 65–99)
Potassium: 4.6 mmol/L (ref 3.5–5.2)
Sodium: 145 mmol/L — ABNORMAL HIGH (ref 134–144)

## 2019-12-03 NOTE — Telephone Encounter (Signed)
CSW received referral to assist patient with medication costs. CSW contacted patient and he states that he is not sure how much his meds will cost and which ones are in need of refills. Patient states he will call back later today to discuss further with CSW once he is home. CSW available as needed. Lasandra Beech, LCSW, CCSW-MCS [phone]

## 2020-01-07 ENCOUNTER — Telehealth (HOSPITAL_COMMUNITY): Payer: Self-pay | Admitting: Pharmacy Technician

## 2020-01-07 NOTE — Telephone Encounter (Signed)
Patient Advocate Encounter   Received notification from [employer] that prior authorization for Sherryll Burger is required.   PA submitted on CoverMyMeds Key  [id number]  Status is pending   Will continue to follow.

## 2020-01-08 ENCOUNTER — Other Ambulatory Visit (HOSPITAL_COMMUNITY): Payer: Self-pay | Admitting: *Deleted

## 2020-01-08 ENCOUNTER — Other Ambulatory Visit (HOSPITAL_COMMUNITY): Payer: Self-pay | Admitting: Cardiology

## 2020-01-08 MED ORDER — ENTRESTO 49-51 MG PO TABS: 1.0000 | ORAL_TABLET | Freq: Two times a day (BID) | ORAL | 2 refills | Status: DC

## 2020-01-08 MED ORDER — CARVEDILOL 3.125 MG PO TABS: 3.1250 mg | ORAL_TABLET | Freq: Two times a day (BID) | ORAL | 2 refills | Status: DC

## 2020-01-08 MED ORDER — FUROSEMIDE 40 MG PO TABS: 40.0000 mg | ORAL_TABLET | Freq: Every day | ORAL | 2 refills | Status: DC

## 2020-01-08 MED ORDER — ROSUVASTATIN CALCIUM 10 MG PO TABS: 10.0000 mg | ORAL_TABLET | Freq: Every day | ORAL | 2 refills | Status: DC

## 2020-01-08 MED ORDER — SPIRONOLACTONE 25 MG PO TABS: 25.0000 mg | ORAL_TABLET | Freq: Every day | ORAL | 2 refills | Status: DC

## 2020-01-08 MED FILL — ENTRESTO 49 MG-51 MG TABLET: 49-51 | 90 days supply | Qty: 180 | Fill #0

## 2020-01-08 MED FILL — SPIRONOLACTONE 25 MG TABS: 25 | 90 days supply | Qty: 90 | Fill #0

## 2020-01-08 MED FILL — FUROSEMIDE 40 MG TAB: 40 | 90 days supply | Qty: 90 | Fill #0

## 2020-01-08 MED FILL — CARVEDILOL 3.125 MG TABLET: 3.125 | 90 days supply | Qty: 180 | Fill #0

## 2020-01-08 MED FILL — ROSUVASTATIN CALCIUM 10 MG: 10 | 90 days supply | Qty: 90 | Fill #0

## 2020-01-08 NOTE — Telephone Encounter (Signed)
Advanced Heart Failure Patient Advocate Encounter  Prior Authorization for has been approved.    PA# [id number] Effective dates: 01/07/20 through 01/06/21  Patients co-pay is $15.00 for 30 days and $37.50 for 90 days. I was able to obtain a $10 co-pay card for him to use on his 90 day co-pays.  Pharmacy Billing Information  BIN: [id number]  PCN: [id number]  Group: [id number]  ID: [id number]  Called and spoke with patient. Since he has insurance we are going to send his medications to [facility] so the patient can get the prescriptions mailed straight to his home.  Archer Asa, CPhT

## 2020-01-22 ENCOUNTER — Other Ambulatory Visit: Payer: Self-pay

## 2020-01-22 ENCOUNTER — Ambulatory Visit (HOSPITAL_COMMUNITY)
Admission: RE | Admit: 2020-01-22 | Discharge: 2020-01-22 | Disposition: A | Discharge status: Home / Self Care | Payer: 59 | Source: Ambulatory Visit | Attending: Adult Health | Admitting: Adult Health

## 2020-01-22 VITALS — BP 117/84 | HR 63 | Wt 160.4 lb

## 2020-01-22 DIAGNOSIS — I340 Nonrheumatic mitral (valve) insufficiency: Secondary | ICD-10-CM | POA: Insufficient documentation

## 2020-01-22 DIAGNOSIS — I110 Hypertensive heart disease with heart failure: Secondary | ICD-10-CM | POA: Diagnosis present

## 2020-01-22 DIAGNOSIS — I428 Other cardiomyopathies: Secondary | ICD-10-CM | POA: Insufficient documentation

## 2020-01-22 DIAGNOSIS — I5042 Chronic combined systolic (congestive) and diastolic (congestive) heart failure: Secondary | ICD-10-CM

## 2020-01-22 DIAGNOSIS — F17290 Nicotine dependence, other tobacco product, uncomplicated: Secondary | ICD-10-CM | POA: Insufficient documentation

## 2020-01-22 DIAGNOSIS — Z79899 Other long term (current) drug therapy: Secondary | ICD-10-CM | POA: Diagnosis not present

## 2020-01-22 DIAGNOSIS — Z8249 Family history of ischemic heart disease and other diseases of the circulatory system: Secondary | ICD-10-CM | POA: Insufficient documentation

## 2020-01-22 DIAGNOSIS — I10 Essential (primary) hypertension: Secondary | ICD-10-CM

## 2020-01-22 DIAGNOSIS — I5022 Chronic systolic (congestive) heart failure: Secondary | ICD-10-CM

## 2020-01-22 DIAGNOSIS — I5023 Acute on chronic systolic (congestive) heart failure: Secondary | ICD-10-CM | POA: Insufficient documentation

## 2020-01-22 DIAGNOSIS — F1010 Alcohol abuse, uncomplicated: Secondary | ICD-10-CM

## 2020-01-22 DIAGNOSIS — F17200 Nicotine dependence, unspecified, uncomplicated: Secondary | ICD-10-CM

## 2020-01-22 LAB — BASIC METABOLIC PANEL
Anion gap: 12 (ref 5–15)
BUN: 12 mg/dL (ref 6–20)
CO2: 22 mmol/L (ref 22–32)
Calcium: 10.1 mg/dL (ref 8.9–10.3)
Chloride: 106 mmol/L (ref 98–111)
Creatinine, Ser: 1.24 mg/dL (ref 0.61–1.24)
GFR, Estimated: >60 mL/min (ref >60)
Glucose, Bld: 92 mg/dL (ref 70–99)
Potassium: 3.8 mmol/L (ref 3.5–5.1)
Sodium: 140 mmol/L (ref 135–145)

## 2020-01-22 MED ORDER — CARVEDILOL 3.125 MG PO TABS: 3.1250 mg | ORAL_TABLET | Freq: Two times a day (BID) | ORAL | 2 refills | Status: DC

## 2020-01-22 MED ORDER — ENTRESTO 49-51 MG PO TABS: 1.0000 | ORAL_TABLET | Freq: Two times a day (BID) | ORAL | 2 refills | Status: DC

## 2020-01-22 NOTE — Progress Notes (Signed)
Advanced Heart Failure Clinic Note   PCP: Patient, No Pcp Per PCP-Cardiologist: Nanetta Batty, MD  HF: Dr Gala Romney  HPI: Martin Keller is a 54 y.o. malewith a hx of HTN, tobacco use, systolic HF (diagnosed 03/2018), and severe MR (diagnosed 12/19). He does not have insurance.  He was admitted 12/1-12/5/19 with acute systolic HF. Echo showed EF 30-35% and severe MR. R/LHC showed normal coronaries, severe NICM with EF 20-25%, and severe 3-4+ MR. TEE completed to further evaluate MR and showed severe functional MR.   Today he returns for HF follow up.Overall feeling fine. Mild SOB with exertion. Denies PND. + Orthopnea Pollyann Kennedy. Appetite ok. No fever or chills. He has not been weighing at home. Taking medications but he didn't know he should take entresto/carvedilol twice a day. Smoking 2 black milds a day. He has applied for disability. He has trouble paying for medications.   Echo 11/2018: EF ~30%  Echo 03/04/18:EF 30-35%, diffuse HK, severe MR, LA severely dilated, RA mod dilated, RV mildly down  [facility] 03/05/18 Ao = 108/68 (84)  LV = 110/15 RA = 2 RV = 39/4 PA = 31/10 (20) PCW = 8 (no significant v-waves) Fick cardiac output/index = 5.0/2.7 SVR = 1307 PVR = 2.4 WU Ao sat = 97% PA sat = 68%, 70% Assessment: 1. Normal coronaries 2. Severe NICM EF 20-25% 3. 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 Well compensated filling pressures  TEE 03/07/18:  EF 25%, diffuse HK, RV mildly decreased function, trivial TR, severe central mitral regurgitation, ERO 0.6 cm^2 by PISA.  Impression: severe functional mitral regurgitation  SH: Smokes 2 black and milds/day. no insurance, no problems with transportation, unable to afford medications. Rare ETOH use. No longer working.  FH: mother with murmur, died of MI at 83 years, father died of MI in 72s.  Review of systems complete and found to be negative unless listed in HPI.   Past Medical History:  Diagnosis Date  . Hyperlipidemia   . Hypertension      Current Outpatient Medications  Medication Sig Dispense Refill  . aspirin EC 81 MG tablet Take 1 tablet (81 mg total) by mouth daily. Swallow whole. 90 tablet 3  . carvedilol (COREG) 3.125 MG tablet Take 1 tablet (3.125 mg total) by mouth 2 (two) times daily with a meal. 180 tablet 2  . furosemide (LASIX) 40 MG tablet Take 1 tablet (40 mg total) by mouth daily. 90 tablet 2  . Multiple Vitamin (MULTIVITAMIN WITH MINERALS) TABS tablet Take 1 tablet by mouth daily.     . rosuvastatin (CRESTOR) 10 MG tablet Take 1 tablet (10 mg total) by mouth at bedtime. 90 tablet 2  . sacubitril-valsartan (ENTRESTO) 49-51 MG Take 1 tablet by mouth 2 (two) times daily. 180 tablet 2  . spironolactone (ALDACTONE) 25 MG tablet Take 1 tablet (25 mg total) by mouth daily. 90 tablet 2   No current facility-administered medications for this encounter.    No Known Allergies    Social History   Socioeconomic History  . Marital status: Divorced    Spouse name: Not on file  . Number of children: Not on file  . Years of education: Not on file  . Highest education level: Not on file  Occupational History  . Not on file  Tobacco Use  . Smoking status: Current Every Day Smoker    Types: Cigars  . Smokeless tobacco: Never Used  Vaping Use  . Vaping Use: Never used  Substance and Sexual Activity  . Alcohol  use: Yes  . Drug use: Yes    Types: Marijuana  . Sexual activity: Yes    Partners: Female    Birth control/protection: None  Other Topics Concern  . Not on file  Social History Narrative  . Not on file   Social Determinants of Health   Financial Resource Strain:   . Difficulty of Paying Living Expenses: Not on file  Food Insecurity:   . Worried About Programme researcher, broadcasting/film/video in the Last Year: Not on file  . Ran Out of Food in the Last Year: Not on file  Transportation Needs:   . Lack of Transportation (Medical): Not on file  . Lack of Transportation (Non-Medical): Not on file  Physical Activity:    . Days of Exercise per Week: Not on file  . Minutes of Exercise per Session: Not on file  Stress:   . Feeling of Stress : Not on file  Social Connections:   . Frequency of Communication with Friends and Family: Not on file  . Frequency of Social Gatherings with Friends and Family: Not on file  . Attends Religious Services: Not on file  . Active Member of Clubs or Organizations: Not on file  . Attends Banker Meetings: Not on file  . Marital Status: Not on file  Intimate Partner Violence:   . Fear of Current or Ex-Partner: Not on file  . Emotionally Abused: Not on file  . Physically Abused: Not on file  . Sexually Abused: Not on file      Family History  Problem Relation Age of Onset  . Heart attack Mother        Died at age of 68 from heart attack.  Marland Kitchen Heart attack Father        Died at age of 59 from heart attack.  Marland Kitchen Heart disease Brother        Heart surgery at age of 68.  . Diabetes Child     Vitals:   01/22/20 1412  BP: 117/84  Pulse: 63  SpO2: 98%  Weight: 72.8 kg (160 lb 6.4 oz)   Wt Readings from Last 3 Encounters:  01/22/20 72.8 kg (160 lb 6.4 oz)  12/02/19 71.9 kg (158 lb 9.6 oz)  11/28/19 72.1 kg (159 lb)    PHYSICAL EXAM: General:  Well appearing. No resp difficulty HEENT: normal Neck: supple. no JVD. Carotids 2+ bilat; no bruits. No lymphadenopathy or thryomegaly appreciated. Cor: PMI nondisplaced. Regular rate & rhythm. No rubs, gallops . MR 2/6. Lungs: clear Abdomen: soft, nontender, nondistended. No hepatosplenomegaly. No bruits or masses. Good bowel sounds. Extremities: no cyanosis, clubbing, rash, edema Neuro: alert & orientedx3, cranial nerves grossly intact. moves all 4 extremities w/o difficulty. Affect pleasant  ASSESSMENT & PLAN: 1. Chronic systolic HF due to NICM (new as of 03/2018). LHC normal coronaries 12/3. Etiology unclear - possibly HTN or ETOH.  - Echo 12/19: EF 30-35%, diffuse HK, severe MR, LA severely dilated, RA  mod dilated, RV mildly down.  - TEE 12/19: EF 25%, severe functional MR - Echo 8/20 EF 25-30% moderate MR - NYHA III. Volume status stable.  - Today he was instructed to increase coreg and entresto to twice a day as prescribed.   - Continue spiro to 25 mg daily  -Check BMET - Plan to repeat ECHO in 3 months.    2. Severe MR - TEE 03/07/18: EF 25%, diffuse HK, RV mildly decreased function, trivial TR, severe central mitral regurgitation, ERO 0.6 cm^2  by PISA.  Impression: severe functional mitral regurgitation.  - Moderate by echo in 8/20.  - Functional MR  3. HTN Stable.   4. Tobacco use - Smoking 2 black and milds/day. - Discussed smoking cessation again today   5. ETOH use -Rarely drinking.   Follow up in 2 weeks to continue to titrate meds. Anticipate adding SGT2i. Discussed medication changes with him. Stressed the importance of medication compliance.    Tonye Becket, NP 01/22/20

## 2020-01-22 NOTE — Patient Instructions (Signed)
Carvedilol Twice daily  Entresto twice daily  Labs done today, your results will be available in MyChart, we will contact you for abnormal readings.  Your physician recommends that you follow up with our APP clinic in 2-3 weeks  Your physician recommends that you return for a Pharmacy visit in 1 month  If you have any questions or concerns before your next appointment please send [place] a message through [place] or call our office at [phone].    TO LEAVE A MESSAGE FOR THE NURSE SELECT OPTION 2, PLEASE LEAVE A MESSAGE INCLUDING: . YOUR NAME . DATE OF BIRTH . CALL BACK NUMBER . REASON FOR CALL**this is important as we prioritize the call backs  YOU WILL RECEIVE A CALL BACK THE SAME DAY AS LONG AS YOU CALL BEFORE 4:00 PM  At the Advanced Heart Failure Clinic, you and your health needs are our priority. As part of our continuing mission to provide you with exceptional heart care, we have created designated Provider Care Teams. These Care Teams include your primary Cardiologist (physician) and Advanced Practice Providers (APPs- Physician Assistants and Nurse Practitioners) who all work together to provide you with the care you need, when you need it.   You may see any of the following providers on your designated Care Team at your next follow up: Marland Kitchen Dr Arvilla Meres . Dr Marca Ancona . Tonye Becket, NP . Robbie Lis, PA . Karle Plumber, PharmD   Please be sure to bring in all your medications bottles to every appointment.

## 2020-02-05 ENCOUNTER — Encounter (HOSPITAL_COMMUNITY): Payer: 59

## 2020-02-18 ENCOUNTER — Inpatient Hospital Stay (HOSPITAL_COMMUNITY): Admission: RE | Admit: 2020-02-18 | Payer: 59 | Source: Ambulatory Visit

## 2020-02-23 ENCOUNTER — Telehealth (HOSPITAL_COMMUNITY): Payer: Self-pay | Admitting: Cardiology

## 2020-02-23 NOTE — Telephone Encounter (Signed)
appt reminder call placed No answer unable to leave message voice mail not set up

## 2020-02-24 ENCOUNTER — Encounter (HOSPITAL_COMMUNITY): Payer: 59

## 2020-02-29 IMAGING — DX DG CHEST 2V
2 series · 2 of 2 positions shown · non-contrast
Comparison: None.

CLINICAL DATA: Initial evaluation for acute shortness of breath,
history of CHF.

EXAM:
CHEST - 2 VIEW

[chest pa]
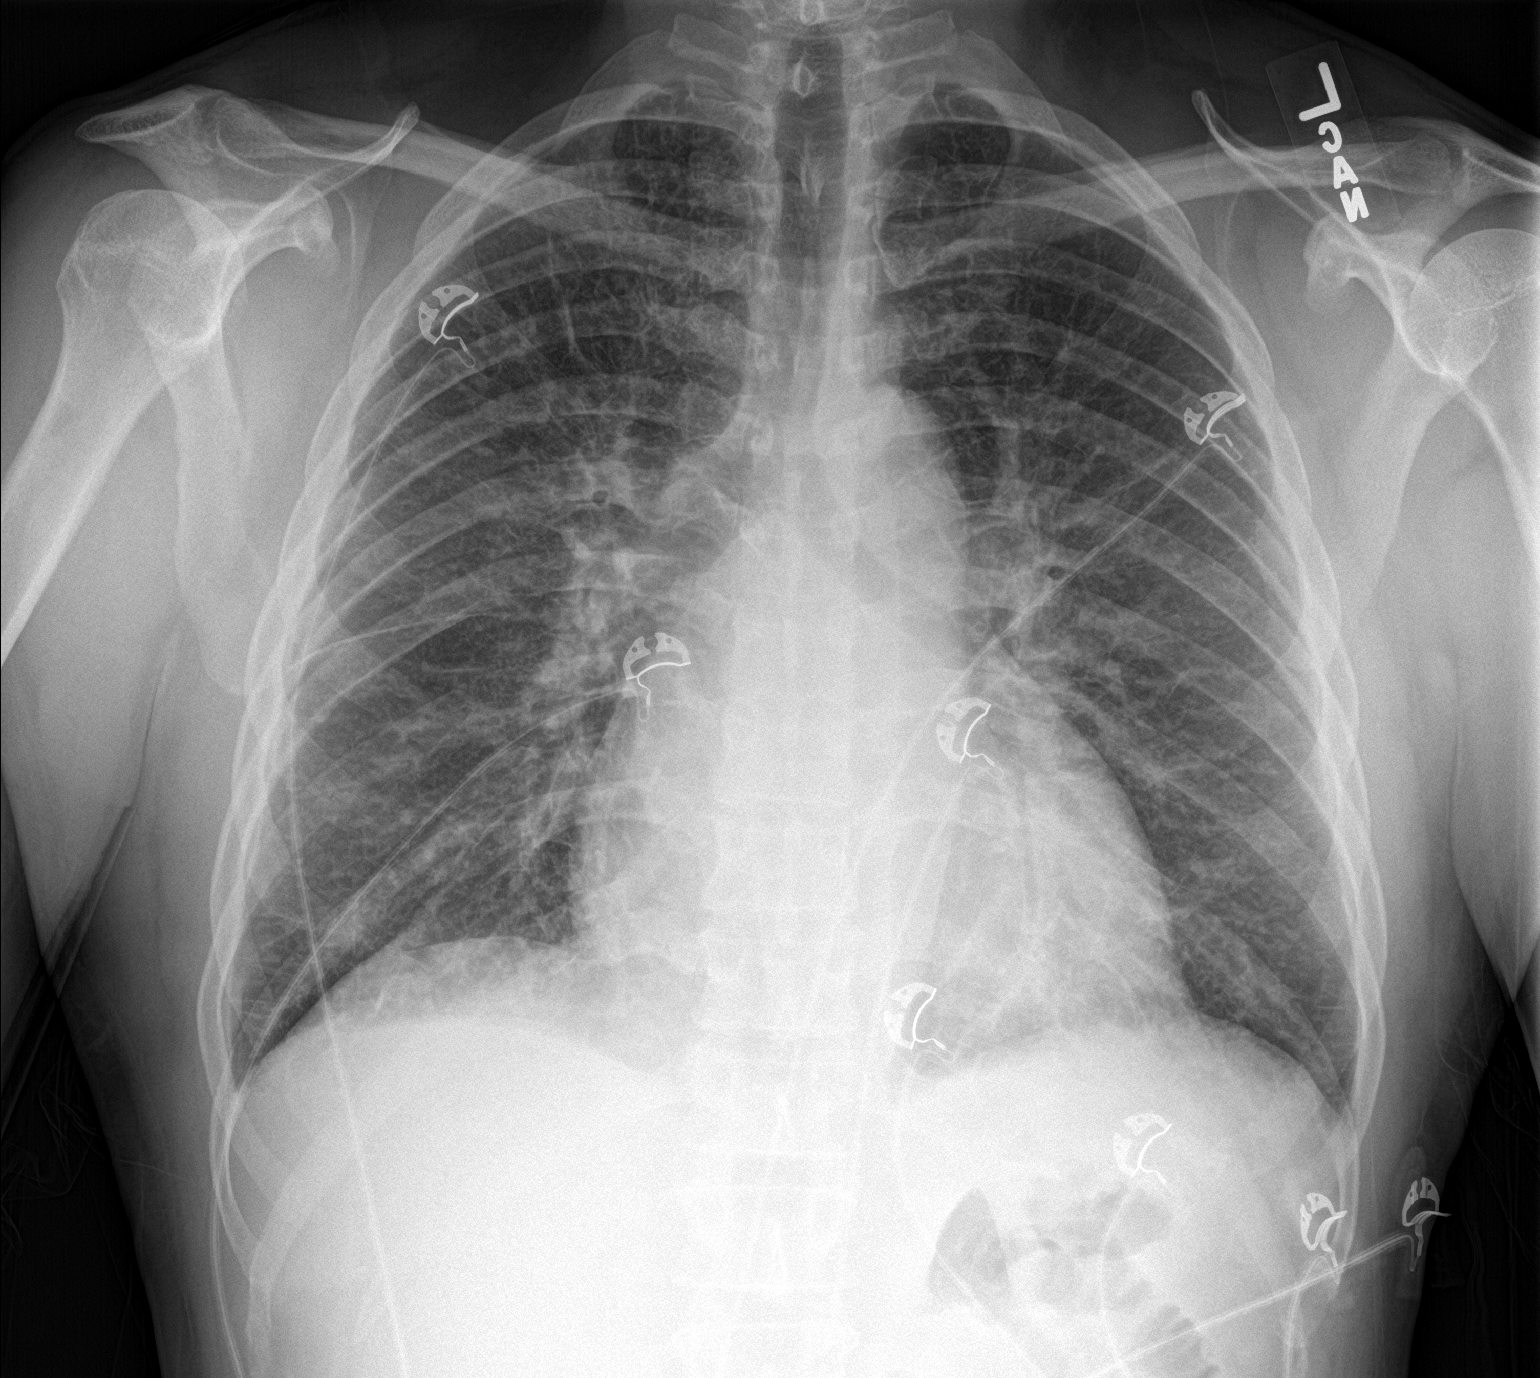

[chest lat]
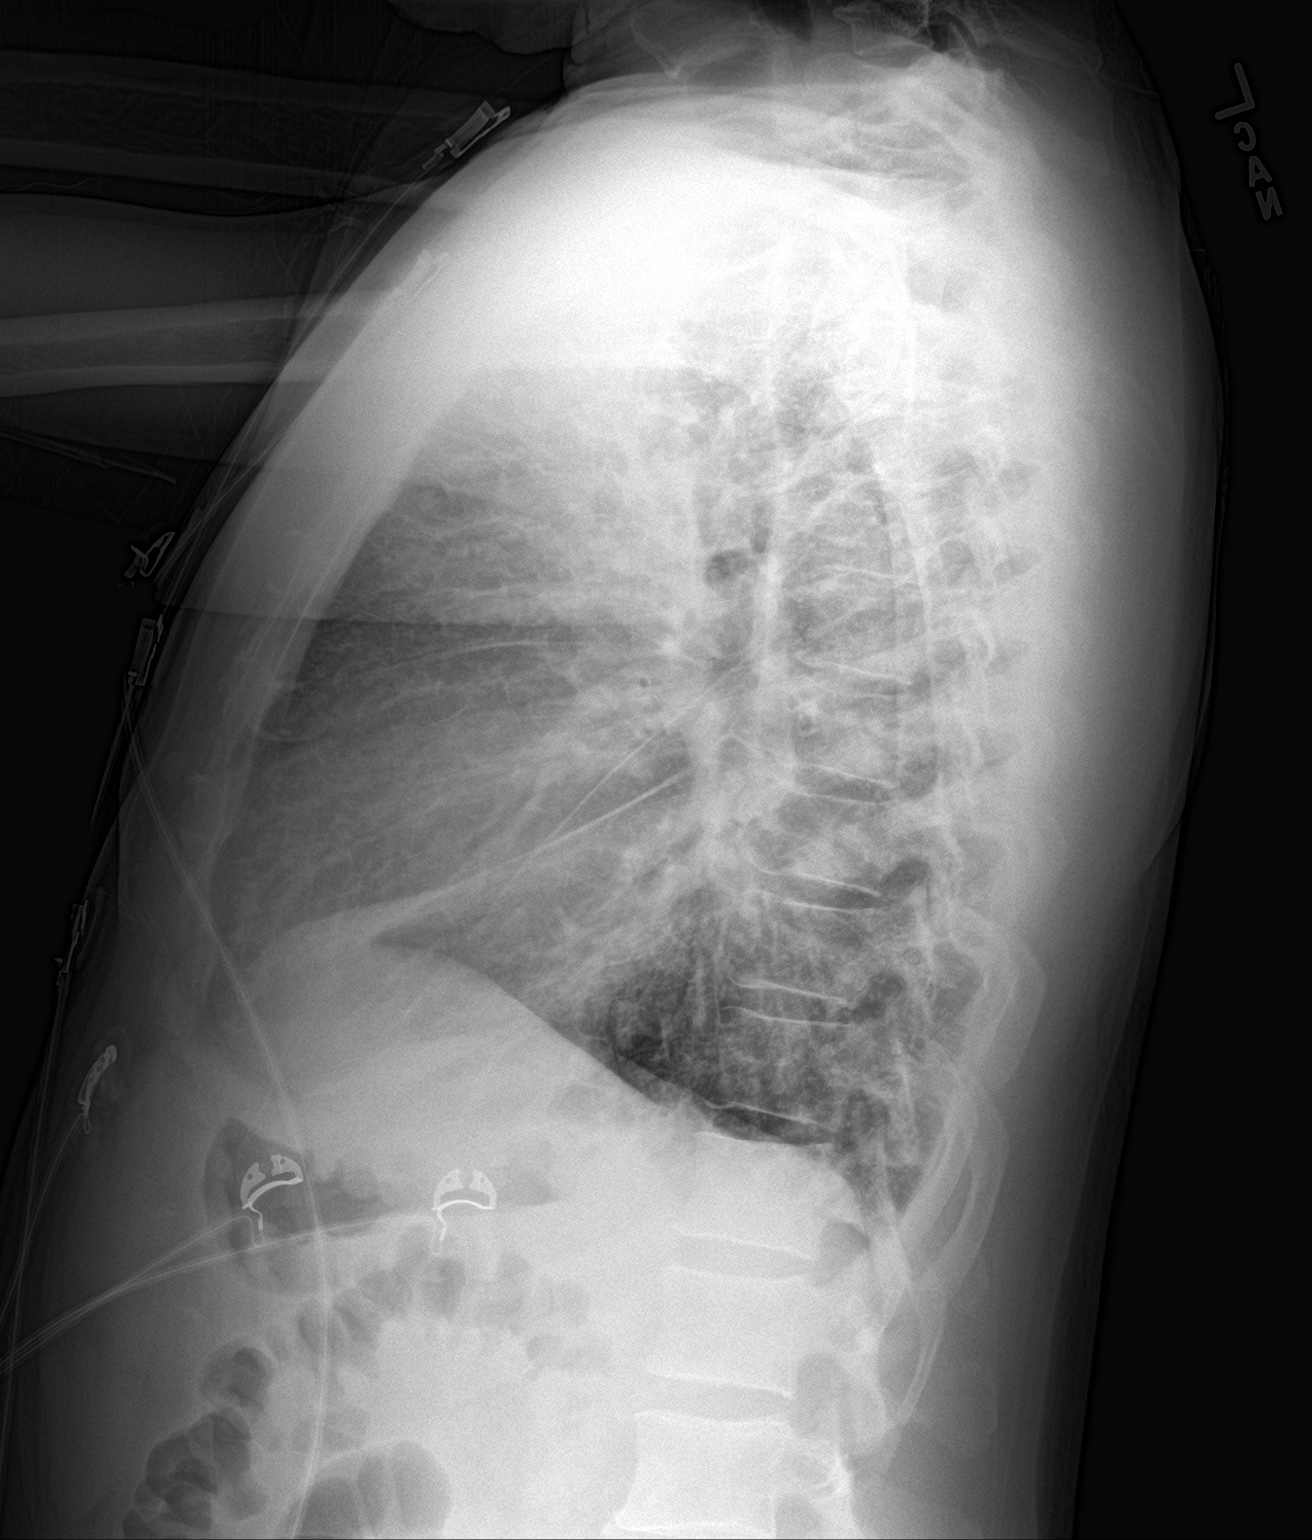

[2 of 2 positions shown; findings below may reference images not displayed]

FINDINGS: Cardiomegaly. Mediastinal silhouette within normal limits.

Lungs normally inflated. Diffuse vascular congestion with
interstitial prominence in scattered Kerley B-lines, compatible with
pulmonary interstitial edema. No focal infiltrates. No significant
pleural effusion. No pneumothorax.

No acute osseus abnormality.
IMPRESSION: Cardiomegaly with mild diffuse pulmonary interstitial edema,
compatible with acute CHF exacerbation.

## 2020-03-08 ENCOUNTER — Encounter (HOSPITAL_COMMUNITY): Payer: 59

## 2020-03-08 ENCOUNTER — Telehealth (HOSPITAL_COMMUNITY): Payer: Self-pay | Admitting: Cardiology

## 2020-03-08 NOTE — Telephone Encounter (Signed)
Appointment reminder call made °No answer unable to leave message °

## 2020-03-10 NOTE — Progress Notes (Signed)
PCP: Patient, No Pcp Per PCP-Cardiologist: Nanetta Batty, MD  HF: Dr Gala Romney  HPI:  Martin Keller is a 54 y.o. malewith ahx of HTN, tobacco use, systolic HF (diagnosed 03/2018), and severe MR (diagnosed 12/19).   He was admitted 12/1-12/5/19 with acute systolic HF. Echo showed EF 30-35% and severe MR. R/LHC showed normal coronaries, severe NICM with EF 20-25%, and severe 3-4+ MR. TEE completed to further evaluate MR and showed severe functional MR.   Recently returned to HF Clinic for follow up on 01/22/20. Overall was feeling fine. Mild SOB with exertion. Denied PND but positive for orthopnea. Appetite was ok. No fever or chills. He had not been weighing at home. Taking medications but he didn't know he should be taking Entresto and carvedilol twice a day. Smoking 2 black milds a day. He had applied for disability. He reported having trouble paying for medications.   Today he returns to HF clinic for pharmacist medication titration. At last visit with [facility], he was instructed to increase Entresto and carvedilol to twice daily dosing (had only been taking once a day). Unfortunately, he is still only taking them once daily. Overall feeling well today. No dizziness, lightheadedness, chest pain or palpitations. Can walk 2 blocks before getting SOB. Weight has been stable. He takes furosemide 40 mg daily and has not needed any extra. No LEE, PND or orthopnea.     HF Medications: Carvedilol 3.125 mg BID Entresto 49/51 mg BID Spironolactone 25 mg daily Furosemide 40 mg daily  Has the patient been experiencing any side effects to the medications prescribed?  no  Does the patient have any problems obtaining medications due to transportation or finances?   No - [facility] [employer]. Fills medications at [facility]  Understanding of regimen: fair Understanding of indications: fair Potential of compliance: fair Patient understands to avoid NSAIDs. Patient understands to avoid  decongestants.    Pertinent Lab Values (01/22/20): Marland Kitchen Serum creatinine 1.24, BUN 12, Potassium 3.8, Sodium 140  Vital Signs: . Weight: 164 lbs (last clinic weight: 160.4 lbs) . Blood pressure: 134/78  . Heart rate: 59   Assessment: 1. Chronic systolic HF due to NICM (new as of 03/2018). LHC normal coronaries 03/05/18. Etiology unclear - possibly HTN or ETOH.  - Echo 12/19: EF 30-35%, diffuse HK, severe MR, LA severely dilated, RA mod dilated, RV mildly down.  - TEE 12/19: EF 25%, severe functional MR - Echo 8/20 EF 25-30% moderate MR - NYHA III. Volume status stable.  - Continue furosemide 40 mg daily - Continue carvedilol 3.125 mg BID - Continue Entresto 49/51 mg BID - Continue spironolactone 25 mg daily - Start Jardiance 10 mg daily - Instructed him to start taking Entresto and carvedilol twice daily (only taking once daily) and reinforced need for medication compliance. Provided pill box to help with compliance.  - Plan to repeat ECHO in 3 months.    2. Severe MR - TEE 03/07/18: EF 25%, diffuse HK, RV mildly decreased function, trivial TR, severe central mitral regurgitation, ERO 0.6 cm^2 by PISA. Impression: severe functional mitral regurgitation.  - Moderate by echo in 8/20.  - Functional MR  3. HTN Stable.   4. Tobacco use - Smoking 2 black and milds/day. - Discussed smoking cessation again today   5. ETOH use -Rarely drinking.    Plan: 1) Medication changes: Based on clinical presentation, vital signs and recent labs will start Jardiance 10 mg daily. Start taking Entresto and carvedilol twice daily.  2) Follow-up:  5 weeks with [facility]   Karle Plumber, PharmD, BCPS, BCCP, CPP Heart Failure Clinic Pharmacist [phone]

## 2020-03-23 ENCOUNTER — Telehealth (HOSPITAL_COMMUNITY): Payer: Self-pay | Admitting: Pharmacy Technician

## 2020-03-23 ENCOUNTER — Other Ambulatory Visit: Payer: Self-pay

## 2020-03-23 ENCOUNTER — Ambulatory Visit (HOSPITAL_COMMUNITY)
Admission: RE | Admit: 2020-03-23 | Discharge: 2020-03-23 | Disposition: A | Discharge status: Home / Self Care | Payer: 59 | Source: Ambulatory Visit | Attending: Internal Medicine | Admitting: Internal Medicine

## 2020-03-23 ENCOUNTER — Other Ambulatory Visit (HOSPITAL_COMMUNITY): Payer: Self-pay | Admitting: Internal Medicine

## 2020-03-23 VITALS — BP 134/78 | HR 59 | Wt 164.0 lb

## 2020-03-23 DIAGNOSIS — I5022 Chronic systolic (congestive) heart failure: Secondary | ICD-10-CM | POA: Insufficient documentation

## 2020-03-23 DIAGNOSIS — I428 Other cardiomyopathies: Secondary | ICD-10-CM | POA: Diagnosis not present

## 2020-03-23 DIAGNOSIS — I340 Nonrheumatic mitral (valve) insufficiency: Secondary | ICD-10-CM | POA: Insufficient documentation

## 2020-03-23 DIAGNOSIS — Z7289 Other problems related to lifestyle: Secondary | ICD-10-CM | POA: Diagnosis not present

## 2020-03-23 DIAGNOSIS — F17210 Nicotine dependence, cigarettes, uncomplicated: Secondary | ICD-10-CM | POA: Diagnosis not present

## 2020-03-23 DIAGNOSIS — I110 Hypertensive heart disease with heart failure: Secondary | ICD-10-CM | POA: Insufficient documentation

## 2020-03-23 MED ORDER — EMPAGLIFLOZIN 10 MG PO TABS: 10.0000 mg | ORAL_TABLET | Freq: Every day | ORAL | 3 refills | Status: DC

## 2020-03-23 MED ORDER — EMPAGLIFLOZIN 10 MG PO TABS: 10.0000 mg | ORAL_TABLET | Freq: Every day | ORAL | 11 refills | Status: DC

## 2020-03-23 MED FILL — JARDIANCE 10 MG TABLET: 10 | 90 days supply | Qty: 90 | Fill #0

## 2020-03-23 NOTE — Telephone Encounter (Signed)
Patient was started on Jardiance in clinic today. His current 90 day co-pay is $37.50. Was able to bring that down to $10 with a co-pay card.  RxBIN: [id number]  RxPCN: Norma Fredrickson  RxGRP: [id number]  ID: [id number]  [facility] has the billing information and will mail the RX to him.  Archer Asa, CPhT

## 2020-03-23 NOTE — Patient Instructions (Addendum)
It was a pleasure seeing you today!  MEDICATIONS: -We are changing your medications today -Start Jardiance 10 mg (1 tablet) daily - Start taking the Entresto and carvedilol twice daily -Call if you have questions about your medications.   NEXT APPOINTMENT: Return to clinic in 5 weeks with [facility].  In general, to take care of your heart failure: -Limit your fluid intake to 2 Liters (half-gallon) per day.   -Limit your salt intake to ideally 2-3 grams (2000-3000 mg) per day. -Weigh yourself daily and record, and bring that "weight diary" to your next appointment.  (Weight gain of 2-3 pounds in 1 day typically means fluid weight.) -The medications for your heart are to help your heart and help you live longer.   -Please contact us before stopping any of your heart medications.  Call the clinic at [phone] with questions or to reschedule future appointments.

## 2020-04-26 ENCOUNTER — Telehealth (HOSPITAL_COMMUNITY): Payer: Self-pay | Admitting: Cardiology

## 2020-04-26 ENCOUNTER — Encounter (HOSPITAL_COMMUNITY): Payer: 59

## 2020-04-26 NOTE — Telephone Encounter (Signed)
appt reminder call made appt details given  Pt reports he is out of town and will need to reschedule

## 2020-04-28 ENCOUNTER — Other Ambulatory Visit: Payer: Self-pay

## 2020-04-28 ENCOUNTER — Ambulatory Visit (HOSPITAL_COMMUNITY)
Admission: RE | Admit: 2020-04-28 | Discharge: 2020-04-28 | Disposition: A | Discharge status: Home / Self Care | Payer: Medicaid Other | Source: Ambulatory Visit | Attending: Family Medicine | Admitting: Family Medicine

## 2020-04-28 ENCOUNTER — Other Ambulatory Visit (HOSPITAL_COMMUNITY): Payer: Self-pay | Admitting: Family Medicine

## 2020-04-28 ENCOUNTER — Encounter (HOSPITAL_COMMUNITY): Payer: Self-pay

## 2020-04-28 VITALS — BP 141/86 | HR 62 | Wt 169.6 lb

## 2020-04-28 DIAGNOSIS — Z8249 Family history of ischemic heart disease and other diseases of the circulatory system: Secondary | ICD-10-CM | POA: Insufficient documentation

## 2020-04-28 DIAGNOSIS — I5042 Chronic combined systolic (congestive) and diastolic (congestive) heart failure: Secondary | ICD-10-CM | POA: Insufficient documentation

## 2020-04-28 DIAGNOSIS — Z7289 Other problems related to lifestyle: Secondary | ICD-10-CM

## 2020-04-28 DIAGNOSIS — E785 Hyperlipidemia, unspecified: Secondary | ICD-10-CM | POA: Insufficient documentation

## 2020-04-28 DIAGNOSIS — I428 Other cardiomyopathies: Secondary | ICD-10-CM | POA: Insufficient documentation

## 2020-04-28 DIAGNOSIS — I340 Nonrheumatic mitral (valve) insufficiency: Secondary | ICD-10-CM | POA: Insufficient documentation

## 2020-04-28 DIAGNOSIS — I5022 Chronic systolic (congestive) heart failure: Secondary | ICD-10-CM | POA: Diagnosis not present

## 2020-04-28 DIAGNOSIS — F17200 Nicotine dependence, unspecified, uncomplicated: Secondary | ICD-10-CM

## 2020-04-28 DIAGNOSIS — Z7982 Long term (current) use of aspirin: Secondary | ICD-10-CM | POA: Insufficient documentation

## 2020-04-28 DIAGNOSIS — Z79899 Other long term (current) drug therapy: Secondary | ICD-10-CM | POA: Diagnosis not present

## 2020-04-28 DIAGNOSIS — I10 Essential (primary) hypertension: Secondary | ICD-10-CM | POA: Diagnosis not present

## 2020-04-28 DIAGNOSIS — F17290 Nicotine dependence, other tobacco product, uncomplicated: Secondary | ICD-10-CM | POA: Diagnosis not present

## 2020-04-28 DIAGNOSIS — Z789 Other specified health status: Secondary | ICD-10-CM

## 2020-04-28 DIAGNOSIS — I110 Hypertensive heart disease with heart failure: Secondary | ICD-10-CM | POA: Insufficient documentation

## 2020-04-28 LAB — BASIC METABOLIC PANEL
Anion gap: 10 (ref 5–15)
BUN: 12 mg/dL (ref 6–20)
CO2: 26 mmol/L (ref 22–32)
Calcium: 9.4 mg/dL (ref 8.9–10.3)
Chloride: 104 mmol/L (ref 98–111)
Creatinine, Ser: 1.07 mg/dL (ref 0.61–1.24)
GFR, Estimated: >60 mL/min (ref >60)
Glucose, Bld: 102 mg/dL — ABNORMAL HIGH (ref 70–99)
Potassium: 3.8 mmol/L (ref 3.5–5.1)
Sodium: 140 mmol/L (ref 135–145)

## 2020-04-28 MED ORDER — ENTRESTO 97-103 MG PO TABS: 1.0000 | ORAL_TABLET | Freq: Two times a day (BID) | ORAL | 3 refills | Status: DC

## 2020-04-28 NOTE — Progress Notes (Signed)
Advanced Heart Failure Clinic Note   PCP: Patient, No Pcp Per PCP-Cardiologist: Nanetta Batty, MD  HF: Dr. Gala Romney  HPI: Martin Keller is a 55 y.o. malewith a hx of HTN, tobacco use, systolic HF (diagnosed 03/2018), and severe MR (diagnosed 12/19). He does not have insurance.  He was admitted 12/1-12/5/19 with acute systolic HF. Echo showed EF 30-35% and severe MR. R/LHC showed normal coronaries, severe NICM with EF 20-25%, and severe 3-4+ MR. TEE completed to further evaluate MR and showed severe functional MR.   HF follow up on 10/21. Overall feeling fine. Mild SOB with exertion. Denies PND. + Orthopnea Pollyann Kennedy. Appetite ok. No fever or chills. He has not been weighing at home. Taking medications but he didn't know he should take Entresto/carvedilol twice a day. Smoking 2 black milds a day. He has applied for disability. He has trouble paying for medications. Instructed to increase Entresto and carvedilol to bid dosing.  Follow up with PharmD 12/21 and he was still only taking Entresto and carvedilol once a day. He was given a bill box and started on Jardiance 10 mg daily.  Today he returns for HF follow up. Overall feeling fine. Gets SOB with stairs or pushing cart in grocery stores. Denies increasing SOB, CP, dizziness, or PND/Orthopnea. Has had some swelling in feet, but has not needed extra lasix. Appetite ok. No fever or chills. Does not weigh at home. Taking all medications as directed. Drinks a 6 pack of beer in a week, still smoking 2-3 black & mild cigars/day.  Echo 11/2018: EF ~30%  Echo 03/04/18: EF 30-35%, diffuse HK, severe MR, LA severely dilated, RA mod dilated, RV mildly down  [facility] 03/05/18 Ao = 108/68 (84)  LV = 110/15 RA = 2 RV = 39/4 PA = 31/10 (20) PCW = 8 (no significant v-waves) Fick cardiac output/index = 5.0/2.7 SVR = 1307 PVR = 2.4 WU Ao sat = 97% PA sat = 68%, 70%  Assessment: 1. Normal coronaries 2. Severe NICM EF 20-25% 3. Severe 3-4+  MR 4. Well compensated filling pressures  TEE 03/07/18:  EF 25%, diffuse HK, RV mildly decreased function, trivial TR, severe central mitral regurgitation, ERO 0.6 cm^2 by PISA.  Impression: severe functional mitral regurgitation  SH: Smokes 2 black and milds/day. no insurance, no problems with transportation, unable to afford medications. Rare ETOH use. No longer working.   FH: mother with murmur, died of MI at 14 years, father died of MI in 32s.   Review of systems complete and found to be negative unless listed in HPI.   Past Medical History:  Diagnosis Date  . Hyperlipidemia   . Hypertension     Current Outpatient Medications  Medication Sig Dispense Refill  . aspirin EC 81 MG tablet Take 1 tablet (81 mg total) by mouth daily. Swallow whole. 90 tablet 3  . carvedilol (COREG) 3.125 MG tablet Take 1 tablet (3.125 mg total) by mouth 2 (two) times daily with a meal. 180 tablet 2  . empagliflozin (JARDIANCE) 10 MG TABS tablet Take 1 tablet (10 mg total) by mouth daily. 90 tablet 3  . furosemide (LASIX) 40 MG tablet Take 1 tablet (40 mg total) by mouth daily. 90 tablet 2  . Multiple Vitamin (MULTIVITAMIN WITH MINERALS) TABS tablet Take 1 tablet by mouth daily.     . rosuvastatin (CRESTOR) 10 MG tablet Take 1 tablet (10 mg total) by mouth at bedtime. 90 tablet 2  . sacubitril-valsartan (ENTRESTO) 49-51 MG Take 1 tablet by mouth  2 (two) times daily. 180 tablet 2  . spironolactone (ALDACTONE) 25 MG tablet Take 1 tablet (25 mg total) by mouth daily. 90 tablet 2   No current facility-administered medications for this encounter.   No Known Allergies   Social History   Socioeconomic History  . Marital status: Divorced    Spouse name: Not on file  . Number of children: Not on file  . Years of education: Not on file  . Highest education level: Not on file  Occupational History  . Not on file  Tobacco Use  . Smoking status: Current Every Day Smoker    Types: Cigars  . Smokeless  tobacco: Never Used  Vaping Use  . Vaping Use: Never used  Substance and Sexual Activity  . Alcohol use: Yes  . Drug use: Yes    Types: Marijuana  . Sexual activity: Yes    Partners: Female    Birth control/protection: None  Other Topics Concern  . Not on file  Social History Narrative  . Not on file   Social Determinants of Health   Financial Resource Strain: Not on file  Food Insecurity: Not on file  Transportation Needs: Not on file  Physical Activity: Not on file  Stress: Not on file  Social Connections: Not on file  Intimate Partner Violence: Not on file     Family History  Problem Relation Age of Onset  . Heart attack Mother        Died at age of 57 from heart attack.  Marland Kitchen Heart attack Father        Died at age of 61 from heart attack.  Marland Kitchen Heart disease Brother        Heart surgery at age of 15.  . Diabetes Child     Vitals:   04/28/20 1408  BP: (!) 141/86  Pulse: 62  SpO2: 98%  Weight: 76.9 kg (169 lb 9.6 oz)   Wt Readings from Last 3 Encounters:  04/28/20 76.9 kg (169 lb 9.6 oz)  03/23/20 74.4 kg (164 lb)  01/22/20 72.8 kg (160 lb 6.4 oz)    PHYSICAL EXAM: General:  NAD. Thin. No resp difficulty HEENT: Normal Neck: Supple. No JVD. Carotids 2+ bilat; no bruits. No lymphadenopathy or thryomegaly appreciated. Cor: PMI nondisplaced. Regular rate & rhythm. No rubs, gallops, II/VI MR Lungs: Clear Abdomen: Soft, nontender, nondistended. No hepatosplenomegaly. No bruits or masses. Good bowel sounds. Extremities: No cyanosis, clubbing, rash, edema Neuro: Alert & oriented x 3, cranial nerves grossly intact. Moves all 4 extremities w/o difficulty. Affect pleasant.  ASSESSMENT & PLAN: 1. Chronic systolic HF due to NICM (new as of 03/2018). LHC normal coronaries 12/3. Etiology unclear - possibly HTN or ETOH.  - Echo 12/19: EF 30-35%, diffuse HK, severe MR, LA severely dilated, RA mod dilated, RV mildly down.  - TEE 12/19: EF 25%, severe functional MR - Echo  8/20 EF 25-30% moderate MR. - NYHA III. Volume status stable.  - Increase Entresto to 97/103 mg bid. - Continue lasix 40 mg daily. May need to back off to PRN with increase of Entresto. - Continue carvedilol 3.125 mg bid. Consider increasing in future. - Continue Jardiance 10 mg daily.  - Continue spiro to 25 mg daily.  - Check BMET today, repeat BMET 7-10 days - Plan to repeat ECHO in 1-2 months.    2. Severe MR - TEE 03/07/18: EF 25%, diffuse HK, RV mildly decreased function, trivial TR, severe central mitral regurgitation, ERO 0.6 cm^2 by  PISA.  Impression: severe functional mitral regurgitation.  - Moderate by echo in 8/20. Repeat echo. - Functional MR.  3. HTN - Elevated today. - Increase Entresto as above.  4. Tobacco use - Smoking 2-3 black and milds/day. - Discussed smoking cessation again today.  - Not ready to quit.  5. ETOH use - Drinking 1 6 pack of beer/week.  Repeat echo and follow up with Dr. Gala Romney in 1-2 months.  Anderson Malta [employer], FNP 04/28/20

## 2020-04-28 NOTE — Patient Instructions (Addendum)
INCREASE Entresto 97/103mg  (1 tablet) twice daily  Labs done today, your results will be available in MyChart, we will contact you for abnormal readings.  Your physician recommends that you schedule repeat labs in 7-10 days  Your physician recommends that you schedule a follow-up appointment in: 2 months with an echocardiogram  Your physician has requested that you have an echocardiogram. Echocardiography is a painless test that uses sound waves to create images of your heart. It provides your doctor with information about the size and shape of your heart and how well your heart's chambers and valves are working. This procedure takes approximately one hour. There are no restrictions for this procedure.  If you have any questions or concerns before your next appointment please send [place] a message through [place] or call our office at [phone].    TO LEAVE A MESSAGE FOR THE NURSE SELECT OPTION 2, PLEASE LEAVE A MESSAGE INCLUDING: . YOUR NAME . DATE OF BIRTH . CALL BACK NUMBER . REASON FOR CALL**this is important as we prioritize the call backs  YOU WILL RECEIVE A CALL BACK THE SAME DAY AS LONG AS YOU CALL BEFORE 4:00 PM

## 2020-05-05 ENCOUNTER — Other Ambulatory Visit (HOSPITAL_COMMUNITY): Payer: Medicaid Other

## 2020-06-24 ENCOUNTER — Other Ambulatory Visit (HOSPITAL_BASED_OUTPATIENT_CLINIC_OR_DEPARTMENT_OTHER): Payer: Self-pay

## 2020-07-08 ENCOUNTER — Encounter (HOSPITAL_COMMUNITY): Payer: Medicaid Other | Admitting: Internal Medicine

## 2020-07-08 ENCOUNTER — Ambulatory Visit (HOSPITAL_COMMUNITY): Admission: RE | Admit: 2020-07-08 | Payer: Medicaid Other | Source: Ambulatory Visit

## 2020-09-22 ENCOUNTER — Encounter (HOSPITAL_COMMUNITY): Payer: Medicaid Other | Admitting: Internal Medicine

## 2020-09-22 ENCOUNTER — Ambulatory Visit (HOSPITAL_COMMUNITY): Admission: RE | Admit: 2020-09-22 | Payer: Medicaid Other | Source: Ambulatory Visit

## 2020-12-10 ENCOUNTER — Ambulatory Visit (HOSPITAL_COMMUNITY)
Admission: RE | Admit: 2020-12-10 | Discharge: 2020-12-10 | Disposition: A | Discharge status: Home / Self Care | Payer: Medicaid Other | Source: Ambulatory Visit | Attending: Internal Medicine | Admitting: Internal Medicine

## 2020-12-10 ENCOUNTER — Other Ambulatory Visit: Payer: Self-pay

## 2020-12-10 ENCOUNTER — Ambulatory Visit (HOSPITAL_COMMUNITY): Admission: RE | Admit: 2020-12-10 | Payer: Medicaid Other | Source: Ambulatory Visit

## 2020-12-10 ENCOUNTER — Other Ambulatory Visit (HOSPITAL_COMMUNITY): Payer: Self-pay

## 2020-12-10 ENCOUNTER — Telehealth (HOSPITAL_COMMUNITY): Payer: Self-pay | Admitting: Pharmacist

## 2020-12-10 VITALS — BP 140/72 | HR 81 | Ht 70.0 in | Wt 162.4 lb

## 2020-12-10 DIAGNOSIS — Z79899 Other long term (current) drug therapy: Secondary | ICD-10-CM | POA: Insufficient documentation

## 2020-12-10 DIAGNOSIS — Z7289 Other problems related to lifestyle: Secondary | ICD-10-CM

## 2020-12-10 DIAGNOSIS — I10 Essential (primary) hypertension: Secondary | ICD-10-CM

## 2020-12-10 DIAGNOSIS — Z9114 Patient's other noncompliance with medication regimen: Secondary | ICD-10-CM | POA: Diagnosis not present

## 2020-12-10 DIAGNOSIS — F1090 Alcohol use, unspecified, uncomplicated: Secondary | ICD-10-CM

## 2020-12-10 DIAGNOSIS — IMO0001 Reserved for inherently not codable concepts without codable children: Secondary | ICD-10-CM

## 2020-12-10 DIAGNOSIS — I110 Hypertensive heart disease with heart failure: Secondary | ICD-10-CM | POA: Diagnosis not present

## 2020-12-10 DIAGNOSIS — I428 Other cardiomyopathies: Secondary | ICD-10-CM | POA: Insufficient documentation

## 2020-12-10 DIAGNOSIS — F17200 Nicotine dependence, unspecified, uncomplicated: Secondary | ICD-10-CM | POA: Diagnosis not present

## 2020-12-10 DIAGNOSIS — I340 Nonrheumatic mitral (valve) insufficiency: Secondary | ICD-10-CM | POA: Diagnosis not present

## 2020-12-10 DIAGNOSIS — F17290 Nicotine dependence, other tobacco product, uncomplicated: Secondary | ICD-10-CM | POA: Insufficient documentation

## 2020-12-10 DIAGNOSIS — I5023 Acute on chronic systolic (congestive) heart failure: Secondary | ICD-10-CM | POA: Insufficient documentation

## 2020-12-10 DIAGNOSIS — Z8249 Family history of ischemic heart disease and other diseases of the circulatory system: Secondary | ICD-10-CM | POA: Diagnosis not present

## 2020-12-10 DIAGNOSIS — Z789 Other specified health status: Secondary | ICD-10-CM

## 2020-12-10 DIAGNOSIS — F1290 Cannabis use, unspecified, uncomplicated: Secondary | ICD-10-CM | POA: Diagnosis not present

## 2020-12-10 DIAGNOSIS — I5022 Chronic systolic (congestive) heart failure: Secondary | ICD-10-CM

## 2020-12-10 DIAGNOSIS — Z7982 Long term (current) use of aspirin: Secondary | ICD-10-CM | POA: Diagnosis not present

## 2020-12-10 DIAGNOSIS — Z91148 Patient's other noncompliance with medication regimen for other reason: Secondary | ICD-10-CM

## 2020-12-10 LAB — BASIC METABOLIC PANEL
Anion gap: 8 (ref 5–15)
BUN: 9 mg/dL (ref 6–20)
CO2: 24 mmol/L (ref 22–32)
Calcium: 9.6 mg/dL (ref 8.9–10.3)
Chloride: 103 mmol/L (ref 98–111)
Creatinine, Ser: 1.05 mg/dL (ref 0.61–1.24)
GFR, Estimated: >60 mL/min (ref >60)
Glucose, Bld: 77 mg/dL (ref 70–99)
Potassium: 4.4 mmol/L (ref 3.5–5.1)
Sodium: 135 mmol/L (ref 135–145)

## 2020-12-10 LAB — BRAIN NATRIURETIC PEPTIDE: B Natriuretic Peptide: 135.3 pg/mL — ABNORMAL HIGH (ref 0.0–100.0)

## 2020-12-10 MED ORDER — SPIRONOLACTONE 25 MG PO TABS
ORAL_TABLET | Freq: Every day | ORAL | 2 refills | Status: DC
  Filled 2020-12-10: qty 90, 90d supply, fill #0

## 2020-12-10 MED ORDER — CARVEDILOL 3.125 MG PO TABS
3.1250 mg | ORAL_TABLET | Freq: Two times a day (BID) | ORAL | 2 refills | Status: DC
  Filled 2020-12-10: qty 180, 90d supply, fill #0

## 2020-12-10 MED ORDER — ADULT MULTIVITAMIN W/MINERALS CH: 1.0000 | ORAL_TABLET | Freq: Every day | ORAL | Status: DC

## 2020-12-10 MED ORDER — SACUBITRIL-VALSARTAN 49-51 MG PO TABS
1.0000 | ORAL_TABLET | Freq: Two times a day (BID) | ORAL | 2 refills | Status: DC
  Filled 2020-12-10: qty 60, 30d supply, fill #0

## 2020-12-10 MED ORDER — ASPIRIN EC 81 MG PO TBEC
81.0000 mg | DELAYED_RELEASE_TABLET | Freq: Every day | ORAL | 3 refills | Status: DC
  Filled 2020-12-10: qty 90, 90d supply, fill #0

## 2020-12-10 MED ORDER — FUROSEMIDE 40 MG PO TABS
ORAL_TABLET | Freq: Every day | ORAL | 3 refills | Status: DC
  Filled 2020-12-10: qty 90, 90d supply, fill #0

## 2020-12-10 MED ORDER — EMPAGLIFLOZIN 10 MG PO TABS
ORAL_TABLET | Freq: Every day | ORAL | 3 refills | Status: DC
  Filled 2020-12-10: qty 30, 30d supply, fill #0

## 2020-12-10 NOTE — Telephone Encounter (Signed)
Patient Advocate Encounter   Received notification from [facility] Medicaid that prior authorization for Jardiance is required.   PA submitted on [facility] Tracks Confirmation #: [id number] W Recipient ID: [id number] P Status is pending   Will continue to follow.'  Karle Plumber, PharmD, BCPS, BCCP, CPP Heart Failure Clinic Pharmacist [phone]

## 2020-12-10 NOTE — Patient Instructions (Addendum)
The following medications have been sent to your pharmacy for refills.  Entresto 97/103mg  (1 tab) twice a day Spironolactone 25mg  (1 tab) daily Jardiance 10mg  (1 tab) Carvedilol 3.125 mg (1 tab) twice a day Furosemide 40 mg Daily Aspirin 81 mg Daily  Labs today We will only contact you if something comes back abnormal or we need to make some changes. Otherwise no news is good news!  Your physician has requested that you have an echocardiogram. Echocardiography is a painless test that uses sound waves to create images of your heart. It provides your doctor with information about the size and shape of your heart and how well your heart's chambers and valves are working. This procedure takes approximately one hour. There are no restrictions for this procedure.   Your physician recommends that you schedule a follow-up appointment in: 2 months with the nurse practitioner/physician assistant  Please call office at [phone] option 2 if you have any questions or concerns.   At the Advanced Heart Failure Clinic, you and your health needs are our priority. As part of our continuing mission to provide you with exceptional heart care, we have created designated Provider Care Teams. These Care Teams include your primary Cardiologist (physician) and Advanced Practice Providers (APPs- Physician Assistants and Nurse Practitioners) who all work together to provide you with the care you need, when you need it.   You may see any of the following providers on your designated Care Team at your next follow up: Dr Dr [phone] Dr Arvilla Meres, NP Marca Ancona, Brandon Melnick Robbie Lis [place], PharmD   Please be sure to bring in all your medications bottles to every appointment.

## 2020-12-10 NOTE — Progress Notes (Signed)
Medication Samples have been provided to the patient.  Drug name: Sherryll Burger       Strength: 49/51mg         Qty: 28  LOT: [id number]  Exp.Date: 7/24  Dosing instructions: 1 tab twice a day   Drug name: Jardiance       Strength: 10mg         Qty: 30  LOT:  Exp.Date: 5/24  Dosing instructions: 1 tab daily     The patient has been instructed regarding the correct time, dose, and frequency of taking this medication, including desired effects and most common side effects.   Princessa Lesmeister Rolonda Pontarelli 4:03 PM 12/10/2020

## 2020-12-10 NOTE — Telephone Encounter (Signed)
Advanced Heart Failure Patient Advocate Encounter  Prior Authorization for Sherryll Burger has been approved.    PA# [id number] Effective dates: 12/10/2020 - 12/10/2021  Karle Plumber, PharmD, BCPS, BCCP, CPP Heart Failure Clinic Pharmacist [phone]

## 2020-12-10 NOTE — Telephone Encounter (Signed)
Patient Advocate Encounter   Received notification from [facility] Medicaid that prior authorization for Sherryll Burger is required.   PA submitted on [facility] Tracks Confirmation #: [id number] W Recipient ID: [id number] P Status is pending   Will continue to follow.  Karle Plumber, PharmD, BCPS, BCCP, CPP Heart Failure Clinic Pharmacist [phone]

## 2020-12-10 NOTE — Progress Notes (Signed)
Advanced Heart Failure Clinic Note   PCP: Patient, No Pcp Per (Inactive) PCP-Cardiologist: Nanetta Batty, MD  HF: Dr. Gala Romney  HPI: Martin Keller is a 55 y.o. male with a hx of HTN, tobacco use, systolic HF (diagnosed 03/2018), and severe MR (diagnosed 12/19). He does not have insurance.   He was admitted 12/19 with acute systolic HF. Echo showed EF 30-35% and severe MR. R/LHC showed normal coronaries EF 20-25% and severe 3-4+ MR. TEE EF 25% severe functional MR.   Follow up with PharmD 12/21 and he was still only taking Entresto and carvedilol once a day. He was given a bill box and started on Jardiance 10 mg daily.  Today he returns for HF follow up. Says he ran out of his meds a couple of months ago and hasn't got them refilled. Not taking any med. Feels ok. Still smoking 2 Black and Milds per day. Drinking a couple beers on the weekdays and some tequilla on the weekends. Denies SOB, CP, orthopnea or PND.   Echo 11/2018: EF ~30%  Echo 03/04/18: EF 30-35%, diffuse HK, severe MR, LA severely dilated, RA mod dilated, RV mildly down   [facility] 03/05/18 Ao = 108/68 (84)  LV = 110/15 RA = 2 RV = 39/4 PA = 31/10 (20) PCW = 8 (no significant v-waves) Fick cardiac output/index = 5.0/2.7 SVR = 1307 PVR =  2.4 WU Ao sat = 97% PA sat = 68%, 70%   Assessment: 1. Normal coronaries 2. Severe NICM EF 20-25% 3. 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 Well compensated filling pressures  TEE 03/07/18:  EF 25%, diffuse HK, RV mildly decreased function, trivial TR, severe central mitral regurgitation, ERO 0.6 cm^2 by PISA.  Impression: severe functional mitral regurgitation  SH: Smokes 2 black and milds/day. no insurance, no problems with transportation, unable to afford medications. Rare ETOH use. No longer working.   FH: mother with murmur, died of MI at 15 years, father died of MI in 75s.   Review of systems complete and found to be negative unless listed in HPI.   Past Medical History:  Diagnosis Date    Hyperlipidemia    Hypertension     Current Outpatient Medications  Medication Sig Dispense Refill   empagliflozin (JARDIANCE) 10 MG TABS tablet TAKE 1 TABLET (10 MG TOTAL) BY MOUTH DAILY. 90 tablet 3   aspirin EC 81 MG tablet Take 1 tablet (81 mg total) by mouth daily. Swallow whole. (Patient not taking: Reported on 12/10/2020) 90 tablet 3   carvedilol (COREG) 3.125 MG tablet Take 1 tablet (3.125 mg total) by mouth 2 (two) times daily with a meal. (Patient not taking: Reported on 12/10/2020) 180 tablet 2   furosemide (LASIX) 40 MG tablet TAKE 1 TABLET (40 MG TOTAL) BY MOUTH DAILY. (Patient not taking: Reported on 12/10/2020) 90 tablet 2   Multiple Vitamin (MULTIVITAMIN WITH MINERALS) TABS tablet Take 1 tablet by mouth daily.  (Patient not taking: Reported on 12/10/2020)     rosuvastatin (CRESTOR) 10 MG tablet TAKE 1 TABLET (10 MG TOTAL) BY MOUTH AT BEDTIME. (Patient not taking: Reported on 12/10/2020) 90 tablet 2   sacubitril-valsartan (ENTRESTO) 97-103 MG TAKE 1 TABLET BY MOUTH 2 (TWO) TIMES DAILY. (Patient not taking: Reported on 12/10/2020) 60 tablet 3   spironolactone (ALDACTONE) 25 MG tablet TAKE 1 TABLET (25 MG TOTAL) BY MOUTH DAILY. (Patient not taking: Reported on 12/10/2020) 90 tablet 2   No current facility-administered medications for this encounter.   No Known Allergies   Social  History   Socioeconomic History   Marital status: Divorced    Spouse name: Not on file   Number of children: Not on file   Years of education: Not on file   Highest education level: Not on file  Occupational History   Not on file  Tobacco Use   Smoking status: Every Day    Types: Cigars   Smokeless tobacco: Never  Vaping Use   Vaping Use: Never used  Substance and Sexual Activity   Alcohol use: Yes   Drug use: Yes    Types: Marijuana   Sexual activity: Yes    Partners: Female    Birth control/protection: None  Other Topics Concern   Not on file  Social History Narrative   Not on file   Social  Determinants of Health   Financial Resource Strain: Not on file  Food Insecurity: Not on file  Transportation Needs: Not on file  Physical Activity: Not on file  Stress: Not on file  Social Connections: Not on file  Intimate Partner Violence: Not on file     Family History  Problem Relation Age of Onset   Heart attack Mother        Died at age of 70 from heart attack.   Heart attack Father        Died at age of 40 from heart attack.   Heart disease Brother        Heart surgery at age of 20.   Diabetes Child     Vitals:   12/10/20 1449  BP: 140/72  Pulse: 81  SpO2: 99%  Weight: 73.7 kg (162 lb 6.4 oz)  Height: 5\' 10"  (1.778 m)   Wt Readings from Last 3 Encounters:  12/10/20 73.7 kg (162 lb 6.4 oz)  04/28/20 76.9 kg (169 lb 9.6 oz)  03/23/20 74.4 kg (164 lb)    PHYSICAL EXAM: General:  Well appearing. No resp difficulty HEENT: normal Neck: supple. JVP 7-8 Carotids 2+ bilat; no bruits. No lymphadenopathy or thryomegaly appreciated. Cor: PMI nondisplaced. Regular rate & rhythm. No rubs, gallops or murmurs. Lungs: clear Abdomen: soft, nontender, nondistended. No hepatosplenomegaly. No bruits or masses. Good bowel sounds. Extremities: no cyanosis, clubbing, rash, edema Neuro: alert & orientedx3, cranial nerves grossly intact. moves all 4 extremities w/o difficulty. Affect pleasant   ASSESSMENT & PLAN:  1. Chronic systolic HF due to NICM (new as of 03/2018). LHC normal coronaries 12/3. Etiology unclear - possibly HTN or ETOH.  - Echo 12/19: EF 30-35%, diffuse HK, severe MR, LA severely dilated, RA mod dilated, RV mildly down.  - TEE 12/19: EF 25%, severe functional MR - Echo 8/20 EF 25-30% moderate MR. - Stable NYHA II off all meds. Volume status elevated at 43% - Restart all meds including: - Lasix 40 daily - Entresto 49/51 mg bid. - Lasix 40 mg daily - Carvedilol 3.125 mg bid. - Jardiance 10 mg daily.  - Spiro 25 mg daily.  - Missed echo today will  resechedule   2. Severe MR - TEE 03/07/18: EF 25%, diffuse HK, RV mildly decreased function, trivial TR, severe central mitral regurgitation, ERO 0.6 cm^2 by PISA.  Impression: severe functional mitral regurgitation.  - Moderate by echo in 8/20. - Functional MR.  - Repeat echo  3. HTN - Elevated today off all meds - Restart meds   4. Tobacco use - Smoking 2-3 black and milds/day. - Discussed smoking cessation again today.  - Not ready to quit.  5. ETOH use -  Discussed need to cut back    Arvilla Meres, MD 12/10/20

## 2020-12-13 ENCOUNTER — Telehealth (HOSPITAL_COMMUNITY): Payer: Self-pay | Admitting: Pharmacy Technician

## 2020-12-13 NOTE — Telephone Encounter (Addendum)
Patient Advocate Encounter   Received notification from Medicaid that prior authorization for Jardiance is required.   PA submitted on [employer] Confirmation [id number] W Status is pending.  Upon further review, [facility]) had a Jardiance PA already in progress for this patient. Confirmation number on [employer],  [id number] W.  Prior Authorization for [place] Pepper has been approved.    PA#  [id number] Effective dates: 12/10/20 through 12/10/21  Canceled prior approval key [id number] W.   Archer Asa, CPhT

## 2020-12-17 ENCOUNTER — Other Ambulatory Visit (HOSPITAL_COMMUNITY): Payer: Self-pay

## 2020-12-20 ENCOUNTER — Other Ambulatory Visit (HOSPITAL_COMMUNITY): Payer: Self-pay

## 2020-12-28 ENCOUNTER — Other Ambulatory Visit (HOSPITAL_COMMUNITY): Payer: Self-pay

## 2021-02-07 ENCOUNTER — Telehealth (HOSPITAL_COMMUNITY): Payer: Self-pay

## 2021-02-07 NOTE — Telephone Encounter (Signed)
Called to confirm/remind patient of their appointment at the Advanced Heart Failure Clinic on 02/08/21.   Patient reminded to bring all medications and/or complete list.  Confirmed patient has transportation. Gave directions, instructed to utilize valet parking.  Confirmed appointment prior to ending call.

## 2021-02-07 NOTE — Progress Notes (Signed)
Advanced [facility] Failure Clinic Note   PCP: Patient, No Pcp Per (Inactive) PCP-Cardiologist: Nanetta Batty, MD  HF: Dr. Gala Romney  HPI: Martin Keller is a 55 y.o. male with a hx of HTN, tobacco use, systolic HF (diagnosed 03/2018), and severe MR (diagnosed 12/19).    He was admitted 12/19 with acute systolic HF. Echo showed EF 30-35% and severe MR. R/LHC showed normal coronaries EF 20-25% and severe 3-4+ MR. TEE EF 25% severe functional MR.   Follow up with PharmD 12/21 and he was still only taking Entresto and carvedilol once a day. He was given a bill box and started on Jardiance 10 mg daily.  Echo today 02/08/21, results pending.   Today he returns for HF follow up. Last visit he had been off all meds x 1 month but feeling OK. Overall feeling fine. Denies increasing SOB, CP, dizziness, edema, or PND/Orthopnea. Appetite ok. No fever or chills. Weight at home stable. He is off Jardiance and takes Entresto and carvedilol once daily. Drinks occasional tequila and smokes 2 black and milds a day. 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   Echo 11/2018: EF ~30%  Echo 03/04/18: EF 30-35%, diffuse HK, severe MR, LA severely dilated, RA mod dilated, RV mildly down   [facility] 03/05/18 Ao = 108/68 (84)  LV = 110/15 RA = 2 RV = 39/4 PA = 31/10 (20) PCW = 8 (no significant v-waves) Fick cardiac output/index = 5.0/2.7 SVR = 1307 PVR =  2.4 WU Ao sat = 97% PA sat = 68%, 70%   Assessment: 1. Normal coronaries 2. Severe NICM EF 20-25% 3. 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 Well compensated filling pressures  TEE 03/07/18:  EF 25%, diffuse HK, RV mildly decreased function, trivial TR, severe central mitral regurgitation, ERO 0.6 cm^2 by PISA.  Impression: severe functional mitral regurgitation  SH: Smokes 2 black and milds/day. Has Medicaid, no problems with transportation. Rare ETOH use. Working now.  FH: mother with murmur, died of MI at 53 years, father died of MI in 23s.   Review of  systems complete and found to be negative unless listed in HPI.   Past Medical History:  Diagnosis Date   Hyperlipidemia    Hypertension     Current Outpatient Medications  Medication Sig Dispense Refill   carvedilol (COREG) 3.125 MG tablet Take 3.125 mg by mouth daily.     empagliflozin (JARDIANCE) 10 MG TABS tablet TAKE 1 TABLET (10 MG TOTAL) BY MOUTH DAILY. 90 tablet 3   furosemide (LASIX) 40 MG tablet TAKE 1 TABLET (40 MG TOTAL) BY MOUTH DAILY. 90 tablet 3   rosuvastatin (CRESTOR) 10 MG tablet TAKE 1 TABLET (10 MG TOTAL) BY MOUTH AT BEDTIME. 90 tablet 2   sacubitril-valsartan (ENTRESTO) 49-51 MG Take 1 tablet by mouth daily.     spironolactone (ALDACTONE) 25 MG tablet TAKE 1 TABLET (25 MG TOTAL) BY MOUTH DAILY. 90 tablet 2   No current facility-administered medications for this encounter.   No Known Allergies   Social History   Socioeconomic History   Marital status: Divorced    Spouse name: Not on file   Number of children: Not on file   Years of education: Not on file   Highest education level: Not on file  Occupational History   Not on file  Tobacco Use   Smoking status: Every Day    Types: Cigars   Smokeless tobacco: Never  Vaping Use   Vaping Use: Never used  Substance  and Sexual Activity   Alcohol use: Yes   Drug use: Yes    Types: Marijuana   Sexual activity: Yes    Partners: Female    Birth control/protection: None  Other Topics Concern   Not on file  Social History Narrative   Not on file   Social Determinants of Health   Financial Resource Strain: Not on file  Food Insecurity: Not on file  Transportation Needs: Not on file  Physical Activity: Not on file  Stress: Not on file  Social Connections: Not on file  Intimate Partner Violence: Not on file   Family History  Problem Relation Age of Onset   Heart attack Mother        Died at age of 51 from heart attack.   Heart attack Father        Died at age of 75 from heart attack.   Heart disease  Brother        Heart surgery at age of 45.   Diabetes Child    BP 132/70   Pulse 73   Wt 75 kg (165 lb 6.4 oz)   SpO2 99%   BMI 23.73 kg/m   Wt Readings from Last 3 Encounters:  02/08/21 75 kg (165 lb 6.4 oz)  12/10/20 73.7 kg (162 lb 6.4 oz)  04/28/20 76.9 kg (169 lb 9.6 oz)   PHYSICAL EXAM: General:  NAD. No resp difficulty HEENT: Normal Neck: Supple. JVP 7-8. Carotids 2+ bilat; no bruits. No lymphadenopathy or thryomegaly appreciated. Cor: PMI nondisplaced. Regular rate & rhythm. No rubs, gallops, 2/6 MR Lungs: Clear Abdomen: Soft, nontender, nondistended. No hepatosplenomegaly. No bruits or masses. Good bowel sounds. Extremities: No cyanosis, clubbing, rash, edema Neuro: Alert & oriented x 3, cranial nerves grossly intact. Moves all 4 extremities w/o difficulty. Affect pleasant.  ASSESSMENT & PLAN:  1. Chronic systolic HF due to NICM (new as of 03/2018).  - LHC normal coronaries 12/3. Etiology unclear - possibly HTN or ETOH.  - Echo 12/19: EF 30-35%, diffuse HK, severe MR, LA severely dilated, RA mod dilated, RV mildly down.  - TEE 12/19: EF 25%, severe functional MR - Echo 8/20 EF 25-30% moderate MR. - Stable NYHA I-II. Volume status mildly elevated on exam, weight up 3 lbs. - Restart Jardiance 10 mg daily. - Continue Entresto 49/51 mg bid. Discussed setting timer on phone to remind to take twice a day, along with carvedilol. - Continue Lasix 40 mg daily - Continue carvedilol 3.125 mg bid. - Continue spironolactone 25 mg daily.  - Echo results pending. - Labs today, repeat BMET in 10 days.   2. Severe MR - TEE 03/07/18: EF 25%, diffuse HK, RV mildly decreased function, trivial TR, severe central mitral regurgitation, ERO 0.6 cm^2 by PISA.  Impression: severe functional mitral regurgitation.  - Moderate by echo in 8/20. - Functional MR.  - Echo today, results pending.  3. HTN - Better today. - Increase Entresto next.   4. Tobacco use - Smoking 2-3 black and  milds/day. - Discussed smoking cessation again today.  - Not ready to quit.  5. ETOH use - Discussed need to cut back   Follow up in 2 months with APP for further medication titration, 4 months with Dr. Gala Romney.   Anderson Malta [place], FNP 02/08/21

## 2021-02-08 ENCOUNTER — Ambulatory Visit (HOSPITAL_BASED_OUTPATIENT_CLINIC_OR_DEPARTMENT_OTHER)
Admission: RE | Admit: 2021-02-08 | Discharge: 2021-02-08 | Disposition: A | Discharge status: Home / Self Care | Payer: Medicaid Other | Source: Ambulatory Visit | Attending: Family Medicine | Admitting: Family Medicine

## 2021-02-08 ENCOUNTER — Encounter (HOSPITAL_COMMUNITY): Payer: Self-pay

## 2021-02-08 ENCOUNTER — Ambulatory Visit (HOSPITAL_COMMUNITY)
Admission: RE | Admit: 2021-02-08 | Discharge: 2021-02-08 | Disposition: A | Discharge status: Home / Self Care | Payer: Medicaid Other | Source: Ambulatory Visit | Attending: Family Medicine | Admitting: Family Medicine

## 2021-02-08 VITALS — BP 132/70 | HR 73 | Wt 165.4 lb

## 2021-02-08 DIAGNOSIS — Z789 Other specified health status: Secondary | ICD-10-CM | POA: Diagnosis not present

## 2021-02-08 DIAGNOSIS — F17290 Nicotine dependence, other tobacco product, uncomplicated: Secondary | ICD-10-CM | POA: Insufficient documentation

## 2021-02-08 DIAGNOSIS — I340 Nonrheumatic mitral (valve) insufficiency: Secondary | ICD-10-CM | POA: Diagnosis not present

## 2021-02-08 DIAGNOSIS — I428 Other cardiomyopathies: Secondary | ICD-10-CM | POA: Insufficient documentation

## 2021-02-08 DIAGNOSIS — I5042 Chronic combined systolic (congestive) and diastolic (congestive) heart failure: Secondary | ICD-10-CM

## 2021-02-08 DIAGNOSIS — I110 Hypertensive heart disease with heart failure: Secondary | ICD-10-CM | POA: Insufficient documentation

## 2021-02-08 DIAGNOSIS — E785 Hyperlipidemia, unspecified: Secondary | ICD-10-CM | POA: Diagnosis not present

## 2021-02-08 DIAGNOSIS — Z8249 Family history of ischemic heart disease and other diseases of the circulatory system: Secondary | ICD-10-CM | POA: Diagnosis not present

## 2021-02-08 DIAGNOSIS — Z7984 Long term (current) use of oral hypoglycemic drugs: Secondary | ICD-10-CM | POA: Diagnosis not present

## 2021-02-08 DIAGNOSIS — Z716 Tobacco abuse counseling: Secondary | ICD-10-CM | POA: Insufficient documentation

## 2021-02-08 DIAGNOSIS — Z9114 Patient's other noncompliance with medication regimen: Secondary | ICD-10-CM

## 2021-02-08 DIAGNOSIS — F1090 Alcohol use, unspecified, uncomplicated: Secondary | ICD-10-CM | POA: Diagnosis not present

## 2021-02-08 DIAGNOSIS — I10 Essential (primary) hypertension: Secondary | ICD-10-CM

## 2021-02-08 DIAGNOSIS — I5022 Chronic systolic (congestive) heart failure: Secondary | ICD-10-CM | POA: Insufficient documentation

## 2021-02-08 DIAGNOSIS — Z720 Tobacco use: Secondary | ICD-10-CM

## 2021-02-08 LAB — BASIC METABOLIC PANEL
Anion gap: 10 (ref 5–15)
BUN: 15 mg/dL (ref 6–20)
CO2: 22 mmol/L (ref 22–32)
Calcium: 9.7 mg/dL (ref 8.9–10.3)
Chloride: 108 mmol/L (ref 98–111)
Creatinine, Ser: 0.97 mg/dL (ref 0.61–1.24)
GFR, Estimated: >60 mL/min (ref >60)
Glucose, Bld: 82 mg/dL (ref 70–99)
Potassium: 3.9 mmol/L (ref 3.5–5.1)
Sodium: 140 mmol/L (ref 135–145)

## 2021-02-08 LAB — ECHOCARDIOGRAM COMPLETE
MV VTI: 1.39 cm2
S' Lateral: 5.7 cm
Single Plane A4C EF: 25.5 %

## 2021-02-08 MED ORDER — EMPAGLIFLOZIN 10 MG PO TABS: 10.0000 mg | ORAL_TABLET | Freq: Every day | ORAL | 1 refills | Status: DC

## 2021-02-08 MED ORDER — SACUBITRIL-VALSARTAN 49-51 MG PO TABS: 1.0000 | ORAL_TABLET | Freq: Two times a day (BID) | ORAL | 1 refills | Status: DC

## 2021-02-08 MED ORDER — CARVEDILOL 3.125 MG PO TABS: 3.1250 mg | ORAL_TABLET | Freq: Two times a day (BID) | ORAL | 1 refills | Status: DC

## 2021-02-08 NOTE — Progress Notes (Signed)
  Echocardiogram 2D Echocardiogram has been performed.  Martin Keller 02/08/2021, 2:46 PM

## 2021-02-08 NOTE — Patient Instructions (Addendum)
Labs were done today, if any labs are abnormal the clinic will call you  Your physician recommends that you return for lab work in: 10-14 days   Your physician recommends that you schedule a follow-up appointment in: 8 weeks in clinic and 4-5 months with Dr. Gala Romney call to make a appointment February 2023  RESTART Jardiance 10 mg 1 tablet daily   TAKE Carvedilol 3.125 mg 1 tablet in the morning and in the evening (2 times a day)  TAKE Entresto 49/51 mg 1 tablet in the morning and in the evening (2 times a day)   At the [facility] Clinic, you and your health needs are our priority. As part of our continuing mission to provide you with exceptional heart care, we have created designated Provider Care Teams. These Care Teams include your primary Cardiologist (physician) and Advanced Practice Providers (APPs- Physician Assistants and Nurse Practitioners) who all work together to provide you with the care you need, when you need it.   You may see any of the following providers on your designated Care Team at your next follow up: Dr Arvilla Meres Dr Carron Curie, NP Robbie Lis, [place] Annie Jeffrey [facility] [place], [place] Karle Plumber, PharmD   Please be sure to bring in all your medications bottles to every appointment.   If you have any questions or concerns before your next appointment please send [place] a message through [place] or call our office at [phone].    TO LEAVE A MESSAGE FOR THE NURSE SELECT OPTION 2, PLEASE LEAVE A MESSAGE INCLUDING: YOUR NAME DATE OF BIRTH CALL BACK NUMBER REASON FOR CALL**this is important as we prioritize the call backs  YOU WILL RECEIVE A CALL BACK THE SAME DAY AS LONG AS YOU CALL BEFORE 4:00 PM

## 2021-02-11 ENCOUNTER — Telehealth (HOSPITAL_COMMUNITY): Payer: Self-pay

## 2021-02-11 NOTE — Telephone Encounter (Signed)
Called pt to get more information no answer/no vm set up.

## 2021-02-11 NOTE — Telephone Encounter (Signed)
Patient left message stating he is having trouble with getting his medication.  No other details.  Please contact patient.

## 2021-02-11 NOTE — Telephone Encounter (Signed)
Patient called back.  He says his medication were transferred to [facility] on [address] but the cost was to high.  Advised patient to contact [facility] to see if they filed his insurance.  He was not even sure what medications he was having trouble with.

## 2021-02-11 NOTE — Telephone Encounter (Signed)
Entered in error

## 2021-02-11 NOTE — Telephone Encounter (Deleted)
Pt left vm asking if it is ok to hold plavix and aspirin for liver biopsy and if so for how long.    Routed to Dr.Bensimhon for advice 

## 2021-02-22 ENCOUNTER — Other Ambulatory Visit (HOSPITAL_COMMUNITY): Payer: Self-pay

## 2021-02-22 ENCOUNTER — Ambulatory Visit (HOSPITAL_COMMUNITY)
Admission: RE | Admit: 2021-02-22 | Discharge: 2021-02-22 | Disposition: A | Discharge status: Home / Self Care | Payer: Medicaid Other | Source: Ambulatory Visit | Attending: Family Medicine | Admitting: Family Medicine

## 2021-02-22 ENCOUNTER — Other Ambulatory Visit (HOSPITAL_COMMUNITY): Payer: Self-pay | Admitting: Cardiology

## 2021-02-22 DIAGNOSIS — I5042 Chronic combined systolic (congestive) and diastolic (congestive) heart failure: Secondary | ICD-10-CM | POA: Diagnosis present

## 2021-02-22 LAB — BASIC METABOLIC PANEL
Anion gap: 9 (ref 5–15)
BUN: 11 mg/dL (ref 6–20)
CO2: 21 mmol/L — ABNORMAL LOW (ref 22–32)
Calcium: 8.9 mg/dL (ref 8.9–10.3)
Chloride: 110 mmol/L (ref 98–111)
Creatinine, Ser: 1.16 mg/dL (ref 0.61–1.24)
GFR, Estimated: >60 mL/min (ref >60)
Glucose, Bld: 135 mg/dL — ABNORMAL HIGH (ref 70–99)
Potassium: 3.9 mmol/L (ref 3.5–5.1)
Sodium: 140 mmol/L (ref 135–145)

## 2021-02-22 MED ORDER — CARVEDILOL 3.125 MG PO TABS
3.1250 mg | ORAL_TABLET | Freq: Two times a day (BID) | ORAL | 1 refills | Status: DC
  Filled 2021-02-22: qty 180, 90d supply, fill #0

## 2021-02-22 MED ORDER — SACUBITRIL-VALSARTAN 49-51 MG PO TABS
1.0000 | ORAL_TABLET | Freq: Two times a day (BID) | ORAL | 1 refills | Status: DC
  Filled 2021-02-22: qty 60, 30d supply, fill #0

## 2021-02-22 MED ORDER — SPIRONOLACTONE 25 MG PO TABS
25.0000 mg | ORAL_TABLET | Freq: Every day | ORAL | 2 refills | Status: DC
  Filled 2021-02-22: qty 90, 90d supply, fill #0

## 2021-02-22 MED ORDER — ROSUVASTATIN CALCIUM 10 MG PO TABS
10.0000 mg | ORAL_TABLET | Freq: Every day | ORAL | 2 refills | Status: DC
  Filled 2021-02-22: qty 90, 90d supply, fill #0

## 2021-02-22 MED ORDER — EMPAGLIFLOZIN 10 MG PO TABS
10.0000 mg | ORAL_TABLET | Freq: Every day | ORAL | 1 refills | Status: DC
  Filled 2021-02-22: qty 90, 90d supply, fill #0

## 2021-02-22 MED ORDER — POTASSIUM CHLORIDE CRYS ER 20 MEQ PO TBCR
20.0000 meq | EXTENDED_RELEASE_TABLET | Freq: Every day | ORAL | 0 refills | Status: DC
  Filled 2021-02-22: qty 30, 30d supply, fill #0

## 2021-02-22 MED ORDER — FUROSEMIDE 40 MG PO TABS
40.0000 mg | ORAL_TABLET | Freq: Every day | ORAL | 3 refills | Status: DC
  Filled 2021-02-22: qty 90, 90d supply, fill #0

## 2021-02-22 NOTE — Progress Notes (Signed)
During patients lab appt, patient c/o increased SOB-denied swelling, CP,  Weight today 169.6 (164 at OV 02/08/21) B/p 144/88 HR 96 O2sat 94% on RA Reds 48%  Patient reported he had not picked up any of his medications Reports the co-pay for entresto was $242 and left all medications at the pharmacy   Reviewed co pays/PA's with Marisue Ivan, [employer] All co pays are $4/90 days and all PA's approved  At the request of the patient meds sent to [facility] Advised to pick up all medications and take them as prescribed. May have to increase lasix with weight increase and SOB however will wait for lab results for further instructions    Will route to provider as well for further instructions and or changes

## 2021-03-02 ENCOUNTER — Other Ambulatory Visit (HOSPITAL_COMMUNITY): Payer: Self-pay

## 2021-03-10 ENCOUNTER — Other Ambulatory Visit (HOSPITAL_COMMUNITY): Payer: Medicaid Other

## 2021-04-11 ENCOUNTER — Telehealth (HOSPITAL_COMMUNITY): Payer: Self-pay

## 2021-04-11 NOTE — Progress Notes (Incomplete)
Advanced Heart Failure Clinic Note   PCP: Patient, No Pcp Per (Inactive) PCP-Cardiologist: Nanetta Batty, MD  HF: Dr. Gala Romney  HPI: Martin Keller is a 56 y.o. male with a hx of HTN, tobacco use, systolic HF (diagnosed 03/2018), and severe MR (diagnosed 12/19).    He was admitted 12/19 with acute systolic HF. Echo showed EF 30-35% and severe MR. R/LHC showed normal coronaries EF 20-25% and severe 3-4+ MR. TEE EF 25% severe functional MR.   Follow up with PharmD 12/21 and he was still only taking Entresto and carvedilol once a day. He was given a bill box and started on Jardiance 10 mg daily.  Echo today 02/08/21, results pending.   Today he returns for HF follow up. Last visit he had been off all meds x 1 month but feeling OK. Overall feeling fine. Denies increasing SOB, CP, dizziness, edema, or PND/Orthopnea. Appetite ok. No fever or chills. Weight at home stable. He is off Jardiance and takes Entresto and carvedilol once daily. Drinks occasional tequila and smokes 2 black and milds a day. 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   Echo 11/2018: EF ~30%  Echo 03/04/18: EF 30-35%, diffuse HK, severe MR, LA severely dilated, RA mod dilated, RV mildly down   [facility] 03/05/18 Ao = 108/68 (84)  LV = 110/15 RA = 2 RV = 39/4 PA = 31/10 (20) PCW = 8 (no significant v-waves) Fick cardiac output/index = 5.0/2.7 SVR = 1307 PVR =  2.4 WU Ao sat = 97% PA sat = 68%, 70%   Assessment: 1. Normal coronaries 2. Severe NICM EF 20-25% 3. 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 Well compensated filling pressures  TEE 03/07/18:  EF 25%, diffuse HK, RV mildly decreased function, trivial TR, severe central mitral regurgitation, ERO 0.6 cm^2 by PISA.  Impression: severe functional mitral regurgitation  SH: Smokes 2 black and milds/day. Has Medicaid, no problems with transportation. Rare ETOH use. Working now.  FH: mother with murmur, died of MI at 48 years, father died of MI in 65s.   Review of  systems complete and found to be negative unless listed in HPI.   Past Medical History:  Diagnosis Date   Hyperlipidemia    Hypertension     Current Outpatient Medications  Medication Sig Dispense Refill   carvedilol (COREG) 3.125 MG tablet Take 1 tablet by mouth 2 times daily with a meal. 180 tablet 1   empagliflozin (JARDIANCE) 10 MG TABS tablet Take 1 tablet (10 mg total) by mouth daily. 90 tablet 1   furosemide (LASIX) 40 MG tablet Take 1 tablet (40 mg total) by mouth daily. 90 tablet 3   potassium chloride SA (KLOR-CON) 20 MEQ tablet Take 1 tablet (20 mEq total) by mouth daily for 3 days. 30 tablet 0   rosuvastatin (CRESTOR) 10 MG tablet Take 1 tablet (10 mg total) by mouth at bedtime. 90 tablet 2   sacubitril-valsartan (ENTRESTO) 49-51 MG Take 1 tablet by mouth 2 (two) times daily. 180 tablet 1   spironolactone (ALDACTONE) 25 MG tablet Take 1 tablet (25 mg total) by mouth daily. 90 tablet 2   No current facility-administered medications for this visit.   No Known Allergies   Social History   Socioeconomic History   Marital status: Divorced    Spouse name: Not on file   Number of children: Not on file   Years of education: Not on file   Highest education level: Not on file  Occupational History  Not on file  Tobacco Use   Smoking status: Every Day    Types: Cigars   Smokeless tobacco: Never  Vaping Use   Vaping Use: Never used  Substance and Sexual Activity   Alcohol use: Yes   Drug use: Yes    Types: Marijuana   Sexual activity: Yes    Partners: Female    Birth control/protection: None  Other Topics Concern   Not on file  Social History Narrative   Not on file   Social Determinants of Health   Financial Resource Strain: Not on file  Food Insecurity: Not on file  Transportation Needs: Not on file  Physical Activity: Not on file  Stress: Not on file  Social Connections: Not on file  Intimate Partner Violence: Not on file   Family History  Problem  Relation Age of Onset   Heart attack Mother        Died at age of 47 from heart attack.   Heart attack Father        Died at age of 58 from heart attack.   Heart disease Brother        Heart surgery at age of 23.   Diabetes Child    There were no vitals taken for this visit.  Wt Readings from Last 3 Encounters:  02/08/21 75 kg (165 lb 6.4 oz)  12/10/20 73.7 kg (162 lb 6.4 oz)  04/28/20 76.9 kg (169 lb 9.6 oz)   PHYSICAL EXAM: General:  NAD. No resp difficulty HEENT: Normal Neck: Supple. JVP 7-8. Carotids 2+ bilat; no bruits. No lymphadenopathy or thryomegaly appreciated. Cor: PMI nondisplaced. Regular rate & rhythm. No rubs, gallops, 2/6 MR Lungs: Clear Abdomen: Soft, nontender, nondistended. No hepatosplenomegaly. No bruits or masses. Good bowel sounds. Extremities: No cyanosis, clubbing, rash, edema Neuro: Alert & oriented x 3, cranial nerves grossly intact. Moves all 4 extremities w/o difficulty. Affect pleasant.  ASSESSMENT & PLAN:  1. Chronic systolic HF due to NICM (new as of 03/2018).  - LHC normal coronaries 12/3. Etiology unclear - possibly HTN or ETOH.  - Echo 12/19: EF 30-35%, diffuse HK, severe MR, LA severely dilated, RA mod dilated, RV mildly down.  - TEE 12/19: EF 25%, severe functional MR - Echo 8/20 EF 25-30% moderate MR. - Stable NYHA I-II. Volume status mildly elevated on exam, weight up 3 lbs. - Restart Jardiance 10 mg daily. - Continue Entresto 49/51 mg bid. Discussed setting timer on phone to remind to take twice a day, along with carvedilol. - Continue Lasix 40 mg daily - Continue carvedilol 3.125 mg bid. - Continue spironolactone 25 mg daily.  - Echo results pending. - Labs today, repeat BMET in 10 days.   2. Severe MR - TEE 03/07/18: EF 25%, diffuse HK, RV mildly decreased function, trivial TR, severe central mitral regurgitation, ERO 0.6 cm^2 by PISA.  Impression: severe functional mitral regurgitation.  - Moderate by echo in 8/20. - Functional  MR.  - Echo today, results pending.  3. HTN - Better today. - Increase Entresto next.   4. Tobacco use - Smoking 2-3 black and milds/day. - Discussed smoking cessation again today.  - Not ready to quit.  5. ETOH use - Discussed need to cut back   Follow up in 2 months with APP for further medication titration, 4 months with Dr. Gala Romney.   Anderson Malta [place], FNP 04/11/21

## 2021-04-11 NOTE — Telephone Encounter (Signed)
Called and was not able to leave patient a voice message to confirm/remind patient of their appointment at the [facility] on 04/12/21 because their answering service is not set up yet. Marland Kitchen

## 2021-04-12 ENCOUNTER — Encounter (HOSPITAL_COMMUNITY): Payer: Medicaid Other

## 2021-04-20 NOTE — Progress Notes (Incomplete)
Advanced Heart Failure Clinic Note   PCP: Patient, No Pcp Per (Inactive) PCP-Cardiologist: Nanetta Batty, MD  HF: Dr. Gala Romney  HPI: Martin Keller is a 56 y.o. male with a hx of HTN, tobacco use, systolic HF (diagnosed 03/2018), and severe MR (diagnosed 12/19).    He was admitted 12/19 with acute systolic HF. Echo showed EF 30-35% and severe MR. R/LHC showed normal coronaries EF 20-25% and severe 3-4+ MR. TEE EF 25% severe functional MR.   Follow up with PharmD 12/21 and he was still only taking Entresto and carvedilol once a day. He was given a bill box and started on Jardiance 10 mg daily.   Had been off all meds and volume overloaded at f/u 09/22. Restarted lasix, entresto, carvedilol, jardiance and spiro.  Last seen for follow-up 11/22.  He was off Jardiance and takes Entresto and carvedilol once daily. Restarted Jardiance. Echo same day EF 25-30%, RV okay, severe MR. cMRI recommended. Wanted time to think about further workup.  No-showed follow-up in December and cancelled f/u last week.  He is here today for follow-up.  Drinks occasional tequila and smokes 2 black and milds a day. 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   Echo 11/2018: EF ~30%  Echo 03/04/18: EF 30-35%, diffuse HK, severe MR, LA severely dilated, RA mod dilated, RV mildly down   [facility] 03/05/18 Ao = 108/68 (84)  LV = 110/15 RA = 2 RV = 39/4 PA = 31/10 (20) PCW = 8 (no significant v-waves) Fick cardiac output/index = 5.0/2.7 SVR = 1307 PVR =  2.4 WU Ao sat = 97% PA sat = 68%, 70%   Assessment: 1. Normal coronaries 2. Severe NICM EF 20-25% 3. 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 Well compensated filling pressures  TEE 03/07/18:  EF 25%, diffuse HK, RV mildly decreased function, trivial TR, severe central mitral regurgitation, ERO 0.6 cm^2 by PISA.  Impression: severe functional mitral regurgitation  SH: Smokes 2 black and milds/day. Has Medicaid, no problems with transportation. Rare ETOH use.  Working now.  FH: mother with murmur, died of MI at 49 years, father died of MI in 55s.   Review of systems complete and found to be negative unless listed in HPI.   Past Medical History:  Diagnosis Date   Hyperlipidemia    Hypertension     Current Outpatient Medications  Medication Sig Dispense Refill   carvedilol (COREG) 3.125 MG tablet Take 1 tablet by mouth 2 times daily with a meal. 180 tablet 1   empagliflozin (JARDIANCE) 10 MG TABS tablet Take 1 tablet (10 mg total) by mouth daily. 90 tablet 1   furosemide (LASIX) 40 MG tablet Take 1 tablet (40 mg total) by mouth daily. 90 tablet 3   potassium chloride SA (KLOR-CON) 20 MEQ tablet Take 1 tablet (20 mEq total) by mouth daily for 3 days. 30 tablet 0   rosuvastatin (CRESTOR) 10 MG tablet Take 1 tablet (10 mg total) by mouth at bedtime. 90 tablet 2   sacubitril-valsartan (ENTRESTO) 49-51 MG Take 1 tablet by mouth 2 (two) times daily. 180 tablet 1   spironolactone (ALDACTONE) 25 MG tablet Take 1 tablet (25 mg total) by mouth daily. 90 tablet 2   No current facility-administered medications for this visit.   No Known Allergies   Social History   Socioeconomic History   Marital status: Divorced    Spouse name: Not on file   Number of children: Not on file   Years of education:  Not on file   Highest education level: Not on file  Occupational History   Not on file  Tobacco Use   Smoking status: Every Day    Types: Cigars   Smokeless tobacco: Never  Vaping Use   Vaping Use: Never used  Substance and Sexual Activity   Alcohol use: Yes   Drug use: Yes    Types: Marijuana   Sexual activity: Yes    Partners: Female    Birth control/protection: None  Other Topics Concern   Not on file  Social History Narrative   Not on file   Social Determinants of Health   Financial Resource Strain: Not on file  Food Insecurity: Not on file  Transportation Needs: Not on file  Physical Activity: Not on file  Stress: Not on file   Social Connections: Not on file  Intimate Partner Violence: Not on file   Family History  Problem Relation Age of Onset   Heart attack Mother        Died at age of 33 from heart attack.   Heart attack Father        Died at age of 61 from heart attack.   Heart disease Brother        Heart surgery at age of 57.   Diabetes Child    There were no vitals taken for this visit.  Wt Readings from Last 3 Encounters:  02/08/21 75 kg (165 lb 6.4 oz)  12/10/20 73.7 kg (162 lb 6.4 oz)  04/28/20 76.9 kg (169 lb 9.6 oz)   PHYSICAL EXAM: General:  NAD. No resp difficulty HEENT: Normal Neck: Supple. JVP 7-8. Carotids 2+ bilat; no bruits. No lymphadenopathy or thryomegaly appreciated. Cor: PMI nondisplaced. Regular rate & rhythm. No rubs, gallops, 2/6 MR Lungs: Clear Abdomen: Soft, nontender, nondistended. No hepatosplenomegaly. No bruits or masses. Good bowel sounds. Extremities: No cyanosis, clubbing, rash, edema Neuro: Alert & oriented x 3, cranial nerves grossly intact. Moves all 4 extremities w/o difficulty. Affect pleasant.  ASSESSMENT & PLAN:  1. Chronic systolic HF due to NICM (new as of 03/2018).  - LHC normal coronaries 12/3. Etiology unclear - possibly HTN or ETOH.  - Echo 12/19: EF 30-35%, diffuse HK, severe MR, LA severely dilated, RA mod dilated, RV mildly down.  - TEE 12/19: EF 25%, severe functional MR - Echo 8/20: EF 25-30% moderate MR. - Echo 11/22: EF 25-30%, RV okay, severe MR - NYHA ***. Volume *** - Continue Jardiance 10 mg daily. - Continue Entresto 49/51 mg bid.  - Continue Lasix 40 mg daily - Continue carvedilol 3.125 mg bid. - Continue spironolactone 25 mg daily.  - Labs today   2. Severe MR - TEE 03/07/18: EF 25%, diffuse HK, RV mildly decreased function, trivial TR, severe central mitral regurgitation, ERO 0.6 cm^2 by PISA.  Impression: severe functional mitral regurgitation.  - Moderate by echo in 8/20. - Severe by echo 11/22  3. HTN ***   4. Tobacco  use - Smoking 2-3 black and milds/day. - Discussed smoking cessation again today.  - Not ready to quit.  5. ETOH use - Discussed need to cut back   Follow up ***  [facility], Shelby Anderle N, PA-C 04/20/21

## 2021-04-21 ENCOUNTER — Encounter (HOSPITAL_COMMUNITY): Payer: Medicaid Other

## 2021-04-25 ENCOUNTER — Other Ambulatory Visit (HOSPITAL_COMMUNITY): Payer: Self-pay | Admitting: Family Medicine

## 2021-04-25 DIAGNOSIS — I5042 Chronic combined systolic (congestive) and diastolic (congestive) heart failure: Secondary | ICD-10-CM

## 2021-07-12 ENCOUNTER — Telehealth (HOSPITAL_COMMUNITY): Payer: Self-pay

## 2021-07-12 NOTE — Telephone Encounter (Signed)
Received a fax requesting medical records from [place]. Records were successfully faxed to: [phone] ,which was the number provided.. Medical request form will be scanned into patients chart.  ? ?

## 2021-11-17 ENCOUNTER — Emergency Department (HOSPITAL_BASED_OUTPATIENT_CLINIC_OR_DEPARTMENT_OTHER)
Admission: EM | Admit: 2021-11-17 | Discharge: 2021-11-17 | Disposition: A | Discharge status: Home / Self Care | Payer: Medicaid Other | Attending: Emergency Medicine | Admitting: Emergency Medicine

## 2021-11-17 ENCOUNTER — Emergency Department (HOSPITAL_BASED_OUTPATIENT_CLINIC_OR_DEPARTMENT_OTHER): Payer: Medicaid Other

## 2021-11-17 ENCOUNTER — Other Ambulatory Visit: Payer: Self-pay

## 2021-11-17 ENCOUNTER — Encounter (HOSPITAL_BASED_OUTPATIENT_CLINIC_OR_DEPARTMENT_OTHER): Payer: Self-pay | Admitting: Emergency Medicine

## 2021-11-17 DIAGNOSIS — I501 Left ventricular failure: Secondary | ICD-10-CM

## 2021-11-17 DIAGNOSIS — J810 Acute pulmonary edema: Secondary | ICD-10-CM | POA: Diagnosis not present

## 2021-11-17 DIAGNOSIS — R55 Syncope and collapse: Secondary | ICD-10-CM | POA: Diagnosis not present

## 2021-11-17 DIAGNOSIS — R1031 Right lower quadrant pain: Secondary | ICD-10-CM | POA: Diagnosis not present

## 2021-11-17 DIAGNOSIS — I509 Heart failure, unspecified: Secondary | ICD-10-CM | POA: Insufficient documentation

## 2021-11-17 DIAGNOSIS — I110 Hypertensive heart disease with heart failure: Secondary | ICD-10-CM | POA: Diagnosis not present

## 2021-11-17 DIAGNOSIS — Z79899 Other long term (current) drug therapy: Secondary | ICD-10-CM | POA: Insufficient documentation

## 2021-11-17 DIAGNOSIS — R0602 Shortness of breath: Secondary | ICD-10-CM | POA: Diagnosis present

## 2021-11-17 LAB — URINALYSIS, ROUTINE W REFLEX MICROSCOPIC
Bilirubin Urine: NEGATIVE
Glucose, UA: NEGATIVE mg/dL
Hgb urine dipstick: NEGATIVE
Ketones, ur: NEGATIVE mg/dL
Leukocytes,Ua: NEGATIVE
Nitrite: NEGATIVE
Protein, ur: 30 mg/dL — AB
Specific Gravity, Urine: 1.02 (ref 1.005–1.030)
pH: 5.5 (ref 5.0–8.0)

## 2021-11-17 LAB — URINALYSIS, MICROSCOPIC (REFLEX)

## 2021-11-17 LAB — BASIC METABOLIC PANEL
Anion gap: 8 (ref 5–15)
BUN: 20 mg/dL (ref 6–20)
CO2: 22 mmol/L (ref 22–32)
Calcium: 9.1 mg/dL (ref 8.9–10.3)
Chloride: 110 mmol/L (ref 98–111)
Creatinine, Ser: 1.19 mg/dL (ref 0.61–1.24)
GFR, Estimated: >60 mL/min (ref >60)
Glucose, Bld: 188 mg/dL — ABNORMAL HIGH (ref 70–99)
Potassium: 4 mmol/L (ref 3.5–5.1)
Sodium: 140 mmol/L (ref 135–145)

## 2021-11-17 LAB — CBG MONITORING, ED: Glucose-Capillary: 103 mg/dL — ABNORMAL HIGH (ref 70–99)

## 2021-11-17 LAB — CBC
HCT: 41.2 % (ref 39.0–52.0)
Hemoglobin: 13.1 g/dL (ref 13.0–17.0)
MCH: 27 pg (ref 26.0–34.0)
MCHC: 31.8 g/dL (ref 30.0–36.0)
MCV: 84.8 fL (ref 80.0–100.0)
Platelets: 191 10*3/uL (ref 150–400)
RBC: 4.86 MIL/uL (ref 4.22–5.81)
RDW: 16.4 % — ABNORMAL HIGH (ref 11.5–15.5)
WBC: 6.1 10*3/uL (ref 4.0–10.5)
nRBC: 0 % (ref 0.0–0.2)

## 2021-11-17 LAB — BRAIN NATRIURETIC PEPTIDE: B Natriuretic Peptide: 1383.4 pg/mL — ABNORMAL HIGH (ref 0.0–100.0)

## 2021-11-17 MED ORDER — FUROSEMIDE 10 MG/ML IJ SOLN
40.0000 mg | Freq: Once | INTRAMUSCULAR | Status: AC
  Administered 2021-11-17: 40 mg via INTRAVENOUS
  Filled 2021-11-17: qty 4

## 2021-11-17 NOTE — Discharge Instructions (Signed)
You were seen today for shortness of breath which appears to be due to to fluid buildup.  As discussed, if your work-up breathing increases or if you feel worsening shortness of breath, please return to the emergency department for reevaluation.  Please follow-up with your cardiologist.  Resume your home medications.

## 2021-11-17 NOTE — ED Notes (Signed)
Dc instructions reviewed with pt no questions or concerns at this time will follow up with pcp 

## 2021-11-17 NOTE — ED Triage Notes (Signed)
  Patient comes in with SOB that he states happened last night and earlier today while at work.  Patient states he was sitting on the couch last night after work, got SOB, and could hear expiratory wheezing.  Patient also states he had sharp RLQ pain at that time, with no N/V.  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  Patient states he felt like he was going to pass out and vision got blurry.  No pain at this time.  Lung sounds clear in triage.  SPO2 98%.

## 2021-11-17 NOTE — ED Notes (Signed)
Pt is aware he needs a urine sample. Pt has been given a urinal 

## 2021-11-17 NOTE — ED Provider Notes (Signed)
[facility] EMERGENCY DEPARTMENT Provider Note   CSN: [id number] Arrival date & time: 11/17/21  1902     History  Chief Complaint  Patient presents with   Shortness of Breath   Near Syncope    Martin Keller is a 56 y.o. male.  Patient presents to the emergency department complaining of shortness of breath that began last night.  He states that he was sitting watching TV and he felt that he could not catch his breath and was breathing rapidly.  He states he had another incident that was similar today while at work.  He feels that he heard wheezing when exhaling.  He also complains of right lower quadrant abdominal pain which began last night but resolved on its own.  He states he thinks it may have been gas.  He denies nausea, vomiting, constipation, diarrhea, abdominal pain at this time, chest pain.  Patient with past medical history significant for CHF, hypertension, history of elevated troponin  HPI     Home Medications Prior to Admission medications   Medication Sig Start Date End Date Taking? Authorizing Provider  carvedilol (COREG) 3.125 MG tablet Take 1 tablet by mouth 2 times daily with a meal. 02/22/21   Milford, Anderson Malta, FNP  empagliflozin (JARDIANCE) 10 MG TABS tablet Take 1 tablet (10 mg total) by mouth daily. 02/22/21   Milford, Anderson Malta, FNP  furosemide (LASIX) 40 MG tablet Take 1 tablet (40 mg total) by mouth daily. 02/22/21   Milford, Anderson Malta, FNP  potassium chloride SA (KLOR-CON) 20 MEQ tablet Take 1 tablet (20 mEq total) by mouth daily for 3 days. 02/22/21   Jacklynn Ganong, FNP  rosuvastatin (CRESTOR) 10 MG tablet Take 1 tablet (10 mg total) by mouth at bedtime. 02/22/21   Milford, Anderson Malta, FNP  sacubitril-valsartan (ENTRESTO) 49-51 MG Take 1 tablet by mouth 2 (two) times daily. 02/22/21   Jacklynn Ganong, FNP  spironolactone (ALDACTONE) 25 MG tablet Take 1 tablet (25 mg total) by mouth daily. 02/22/21   Jacklynn Ganong, FNP       Allergies    Patient has no known allergies.    Review of Systems   Review of Systems  Constitutional:  Negative for fever.  Respiratory:  Positive for shortness of breath and wheezing. Negative for cough.   Cardiovascular:  Negative for chest pain.  Gastrointestinal:  Negative for abdominal pain, constipation, diarrhea, nausea and vomiting.    Physical Exam Updated Vital Signs BP (!) 144/100   Pulse 77   Temp 98.1 F (36.7 C) (Oral)   Resp 15   Ht 5\' 10"  (1.778 m)   Wt 74.8 kg   SpO2 100%   BMI 23.68 kg/m  Physical Exam Vitals and nursing note reviewed.  Constitutional:      General: He is not in acute distress. HENT:     Head: Normocephalic and atraumatic.     Mouth/Throat:     Mouth: Mucous membranes are moist.  Eyes:     Extraocular Movements: Extraocular movements intact.  Cardiovascular:     Rate and Rhythm: Normal rate and regular rhythm.     Heart sounds: Normal heart sounds.  Pulmonary:     Effort: Pulmonary effort is normal.     Breath sounds: Normal breath sounds. No wheezing.  Chest:     Chest wall: No tenderness.  Abdominal:     Palpations: Abdomen is soft.     Tenderness: There is no abdominal tenderness.  Musculoskeletal:  General: Normal range of motion.     Cervical back: Normal range of motion and neck supple.     Right lower leg: No edema.     Left lower leg: No edema.  Skin:    General: Skin is warm and dry.     Capillary Refill: Capillary refill takes less than 2 seconds.  Neurological:     Mental Status: He is alert and oriented to person, place, and time.     ED Results / Procedures / Treatments   Labs (all labs ordered are listed, but only abnormal results are displayed) Labs Reviewed  BASIC METABOLIC PANEL - Abnormal; Notable for the following components:      Result Value   Glucose, Bld 188 (*)    All other components within normal limits  CBC - Abnormal; Notable for the following components:   RDW 16.4 (*)    All  other components within normal limits  URINALYSIS, ROUTINE W REFLEX MICROSCOPIC - Abnormal; Notable for the following components:   Protein, ur 30 (*)    All other components within normal limits  BRAIN NATRIURETIC PEPTIDE - Abnormal; Notable for the following components:   B Natriuretic Peptide 1,383.4 (*)    All other components within normal limits  URINALYSIS, MICROSCOPIC (REFLEX) - Abnormal; Notable for the following components:   Bacteria, UA RARE (*)    Non Squamous Epithelial PRESENT (*)    All other components within normal limits  CBG MONITORING, ED - Abnormal; Notable for the following components:   Glucose-Capillary 103 (*)    All other components within normal limits    EKG EKG Interpretation  Date/Time:  Thursday November 17 2021 19:15:32 EDT Ventricular Rate:  80 PR Interval:  152 QRS Duration: 92 QT Interval:  394 QTC Calculation: 454 R Axis:   -15 Text Interpretation: Normal sinus rhythm Biatrial enlargement Minimal voltage criteria for LVH, may be normal variant ( Sokolow-Lyon ) Cannot rule out Anterior infarct , age undetermined Abnormal ECG No significant change since last tracing Confirmed by Melene Plan [phone]) on 11/17/2021 7:36:51 PM  Radiology DG Chest 2 View  Result Date: 11/17/2021 CLINICAL DATA:  Shortness of breath EXAM: CHEST - 2 VIEW COMPARISON:  Chest x-ray dated March 03, 2018 FINDINGS: Unchanged cardiomegaly. Mild bilateral interstitial opacities. No pleural effusion pneumothorax. IMPRESSION: Cardiomegaly and mild pulmonary edema. Electronically Signed   By: Allegra Lai M.D.   On: 11/17/2021 20:05    Procedures Procedures    Medications Ordered in ED Medications  furosemide (LASIX) injection 40 mg (40 mg Intravenous Given 11/17/21 2113)    ED Course/ Medical Decision Making/ A&P                           Medical Decision Making Amount and/or Complexity of Data Reviewed Labs: ordered. Radiology: ordered.  Risk Prescription drug  management.   This patient presents to the ED for concern of shortness of breath, this involves an extensive number of treatment options, and is a complaint that carries with it a high risk of complications and morbidity.  The differential diagnosis includes CHF exacerbation, anxiety, pulmonary embolism, and others   Co morbidities that complicate the patient evaluation  History of CHF   Additional history obtained:   External records from outside source obtained and reviewed including cardiology notes for chronic systolic congestive heart failure   Lab Tests:  I Ordered, and personally interpreted labs.  The pertinent results include:  glucose  188, BNP 1383, grossly normal CBC, unremarkable urinalysis   Imaging Studies ordered:  I ordered imaging studies including chest x-ray I independently visualized and interpreted imaging which showed cardiomegaly and mild pulmonary edema I agree with the radiologist interpretation   Cardiac Monitoring: / EKG:  The patient was maintained on a cardiac monitor.  I personally viewed and interpreted the cardiac monitored which showed an underlying rhythm of: Sinus rhythm  Problem List / ED Course / Critical interventions / Medication management   I ordered medication including Lasix for fluid overload Reevaluation of the patient after these medicines showed that the patient improved I have reviewed the patients home medicines and have made adjustments as needed   Test / Admission - Considered:  The patient has a CHF history and I believe this is an exacerbation.  He does not have chest pain to suggest a pulmonary embolism.  After Lasix administration the patient urinated a large volume and felt much better.  He takes Lasix at home.  He does not want to stay in the hospital.  I believe this is reasonable.  Patient will discharge home at this time and resume his home medication regimen with plans to follow-up with cardiology.  If his work of  breathing worsens at all, I recommend that he return to the hospital for possible admission        Final Clinical Impression(s) / ED Diagnoses Final diagnoses:  Congestive heart failure, unspecified HF chronicity, unspecified heart failure type [facility])  Pulmonary edema cardiac cause [facility])    Rx / DC Orders ED Discharge Orders     None         Pamala Duffel 11/17/21 2228    Melene Plan, DO 11/17/21 2239

## 2022-05-18 ENCOUNTER — Other Ambulatory Visit (HOSPITAL_COMMUNITY): Payer: Self-pay | Admitting: Family Medicine

## 2022-05-22 ENCOUNTER — Telehealth (HOSPITAL_COMMUNITY): Payer: Self-pay

## 2022-05-22 ENCOUNTER — Other Ambulatory Visit (HOSPITAL_COMMUNITY): Payer: Self-pay

## 2022-05-22 NOTE — Telephone Encounter (Signed)
Advanced Heart Failure Patient Advocate Encounter  Prior authorization is required for Entresto. PA submitted and APPROVED on 05/22/22.  [facility] Effective: 05/22/22 - 05/22/23  Test billing returns $4 copay for 90 day supply.  Clista Bernhardt, CPhT Rx Patient Advocate Phone: [phone]

## 2022-05-22 NOTE — Telephone Encounter (Signed)
Advanced Heart Failure Patient Advocate Encounter  Prior authorization is required for Jardiance. PA submitted and APPROVED on 05/22/22. Test billing returns $4 copay for 90 days.  Key [id number] Effective: 05/22/22 - 05/22/23  Clista Bernhardt, CPhT Rx Patient Advocate Phone: [phone]

## 2023-04-30 ENCOUNTER — Other Ambulatory Visit (HOSPITAL_COMMUNITY): Payer: Self-pay

## 2023-07-25 ENCOUNTER — Other Ambulatory Visit: Payer: Self-pay

## 2023-07-25 ENCOUNTER — Encounter (HOSPITAL_BASED_OUTPATIENT_CLINIC_OR_DEPARTMENT_OTHER): Payer: Self-pay | Admitting: Emergency Medicine

## 2023-07-25 ENCOUNTER — Emergency Department (HOSPITAL_BASED_OUTPATIENT_CLINIC_OR_DEPARTMENT_OTHER): Payer: Self-pay

## 2023-07-25 ENCOUNTER — Inpatient Hospital Stay (HOSPITAL_BASED_OUTPATIENT_CLINIC_OR_DEPARTMENT_OTHER)
Admission: EM | Admit: 2023-07-25 | Discharge: 2023-07-27 | DRG: 291 | Disposition: A | Discharge status: Home / Self Care | Payer: Self-pay | Attending: Internal Medicine | Admitting: Internal Medicine

## 2023-07-25 DIAGNOSIS — I5023 Acute on chronic systolic (congestive) heart failure: Secondary | ICD-10-CM | POA: Diagnosis present

## 2023-07-25 DIAGNOSIS — Z556 Problems related to health literacy: Secondary | ICD-10-CM

## 2023-07-25 DIAGNOSIS — Z8249 Family history of ischemic heart disease and other diseases of the circulatory system: Secondary | ICD-10-CM

## 2023-07-25 DIAGNOSIS — Z833 Family history of diabetes mellitus: Secondary | ICD-10-CM

## 2023-07-25 DIAGNOSIS — I420 Dilated cardiomyopathy: Secondary | ICD-10-CM | POA: Diagnosis present

## 2023-07-25 DIAGNOSIS — Z634 Disappearance and death of family member: Secondary | ICD-10-CM

## 2023-07-25 DIAGNOSIS — I110 Hypertensive heart disease with heart failure: Principal | ICD-10-CM | POA: Diagnosis present

## 2023-07-25 DIAGNOSIS — Z79899 Other long term (current) drug therapy: Secondary | ICD-10-CM

## 2023-07-25 DIAGNOSIS — I340 Nonrheumatic mitral (valve) insufficiency: Secondary | ICD-10-CM | POA: Diagnosis present

## 2023-07-25 DIAGNOSIS — Z7984 Long term (current) use of oral hypoglycemic drugs: Secondary | ICD-10-CM

## 2023-07-25 DIAGNOSIS — F17290 Nicotine dependence, other tobacco product, uncomplicated: Secondary | ICD-10-CM | POA: Diagnosis present

## 2023-07-25 DIAGNOSIS — I509 Heart failure, unspecified: Principal | ICD-10-CM

## 2023-07-25 DIAGNOSIS — E785 Hyperlipidemia, unspecified: Secondary | ICD-10-CM | POA: Diagnosis present

## 2023-07-25 DIAGNOSIS — K761 Chronic passive congestion of liver: Secondary | ICD-10-CM | POA: Diagnosis present

## 2023-07-25 DIAGNOSIS — R0602 Shortness of breath: Secondary | ICD-10-CM

## 2023-07-25 DIAGNOSIS — Z91148 Patient's other noncompliance with medication regimen for other reason: Secondary | ICD-10-CM

## 2023-07-25 DIAGNOSIS — E1151 Type 2 diabetes mellitus with diabetic peripheral angiopathy without gangrene: Secondary | ICD-10-CM | POA: Diagnosis present

## 2023-07-25 LAB — LACTIC ACID, PLASMA: Lactic Acid, Venous: 1.3 mmol/L (ref 0.5–1.9)

## 2023-07-25 LAB — COMPREHENSIVE METABOLIC PANEL WITH GFR
ALT: 33 U/L (ref 0–44)
AST: 41 U/L (ref 15–41)
Albumin: 4.4 g/dL (ref 3.5–5.0)
Alkaline Phosphatase: 127 U/L — ABNORMAL HIGH (ref 38–126)
Anion gap: 16 — ABNORMAL HIGH (ref 5–15)
BUN: 20 mg/dL (ref 6–20)
CO2: 18 mmol/L — ABNORMAL LOW (ref 22–32)
Calcium: 9.8 mg/dL (ref 8.9–10.3)
Chloride: 103 mmol/L (ref 98–111)
Creatinine, Ser: 1.35 mg/dL — ABNORMAL HIGH (ref 0.61–1.24)
GFR, Estimated: >60 mL/min (ref >60)
Glucose, Bld: 114 mg/dL — ABNORMAL HIGH (ref 70–99)
Potassium: 4.4 mmol/L (ref 3.5–5.1)
Sodium: 137 mmol/L (ref 135–145)
Total Bilirubin: 2 mg/dL — ABNORMAL HIGH (ref 0.0–1.2)
Total Protein: 7.9 g/dL (ref 6.5–8.1)

## 2023-07-25 LAB — CBC WITH DIFFERENTIAL/PLATELET
Abs Immature Granulocytes: 0.04 10*3/uL (ref 0.00–0.07)
Basophils Absolute: 0.1 10*3/uL (ref 0.0–0.1)
Basophils Relative: 1 %
Eosinophils Absolute: 0 10*3/uL (ref 0.0–0.5)
Eosinophils Relative: 0 %
HCT: 42.5 % (ref 39.0–52.0)
Hemoglobin: 13.6 g/dL (ref 13.0–17.0)
Immature Granulocytes: 0 %
Lymphocytes Relative: 37 %
Lymphs Abs: 3.3 10*3/uL (ref 0.7–4.0)
MCH: 27.2 pg (ref 26.0–34.0)
MCHC: 32 g/dL (ref 30.0–36.0)
MCV: 85 fL (ref 80.0–100.0)
Monocytes Absolute: 0.9 10*3/uL (ref 0.1–1.0)
Monocytes Relative: 10 %
Neutro Abs: 4.7 10*3/uL (ref 1.7–7.7)
Neutrophils Relative %: 52 %
Platelets: 185 10*3/uL (ref 150–400)
RBC: 5 MIL/uL (ref 4.22–5.81)
RDW: 17.4 % — ABNORMAL HIGH (ref 11.5–15.5)
WBC: 9 10*3/uL (ref 4.0–10.5)
nRBC: 0 % (ref 0.0–0.2)

## 2023-07-25 LAB — PRO BRAIN NATRIURETIC PEPTIDE: Pro Brain Natriuretic Peptide: 4180 pg/mL — ABNORMAL HIGH (ref <300.0)

## 2023-07-25 LAB — LACTATE DEHYDROGENASE: LDH: 282 U/L — ABNORMAL HIGH (ref 98–192)

## 2023-07-25 MED ORDER — FUROSEMIDE 10 MG/ML IJ SOLN
80.0000 mg | Freq: Once | INTRAMUSCULAR | Status: AC
  Administered 2023-07-25: 80 mg via INTRAVENOUS
  Filled 2023-07-25: qty 8

## 2023-07-25 NOTE — ED Provider Notes (Incomplete)
 [place] [facility] DEPARTMENT AT [facility] Provider Note   CSN: [id number] Arrival date & time: 07/25/23  1959     History {Add pertinent medical, surgical, social history, OB history to HPI:1} Chief Complaint  Patient presents with  . Shortness of Breath    Martin Keller is a 58 y.o. male with history of hypertension, peripheral arterial disease, dilated cardiomyopathy with mitral regurgitation, presents with concern for shortness of breath for the past 2 weeks.  Shortness of breath worsens when he lays flat.  States he has been having to sleep upright for the past 2 weeks.  Shortness of breath also worsens with exertion.  Also reports some increased bilateral lower extremity swelling.  Denies any cough, fever or chills.   Shortness of Breath      Home Medications Prior to Admission medications   Medication Sig Start Date End Date Taking? Authorizing Provider  carvedilol  (COREG ) 3.125 MG tablet TAKE 1 TABLET BY MOUTH TWICE DAILY WITH A MEAL 05/19/22   Elmarie Hacking, FNP  ENTRESTO  49-51 MG TAKE 1 TABLET BY MOUTH TWICE DAILY 05/19/22   Elmarie Hacking, FNP  furosemide  (LASIX ) 40 MG tablet Take 1 tablet (40 mg total) by mouth daily. 02/22/21   Elmarie Hacking, FNP  JARDIANCE  10 MG TABS tablet TAKE 1 TABLET(10 MG) BY MOUTH DAILY 05/19/22   Milford, Arlice Bene, FNP  potassium chloride  SA (KLOR-CON ) 20 MEQ tablet Take 1 tablet (20 mEq total) by mouth daily for 3 days. 02/22/21   Elmarie Hacking, FNP  rosuvastatin  (CRESTOR ) 10 MG tablet Take 1 tablet (10 mg total) by mouth at bedtime. 02/22/21   Elmarie Hacking, FNP  spironolactone  (ALDACTONE ) 25 MG tablet Take 1 tablet (25 mg total) by mouth daily. 02/22/21   Elmarie Hacking, FNP      Allergies    Patient has no known allergies.    Review of Systems   Review of Systems  Respiratory:  Positive for shortness of breath.     Physical Exam Updated Vital Signs BP (!) 143/101 (BP Location: Left Arm)    Pulse 87   Temp 97.8 F (36.6 C) (Oral)   Resp (!) 28   Ht 5\' 10"  (1.778 m)   Wt 74.8 kg   SpO2 99%   BMI 23.68 kg/m  Physical Exam Vitals and nursing note reviewed.  Constitutional:      General: He is not in acute distress.    Appearance: He is well-developed.  HENT:     Head: Normocephalic and atraumatic.  Eyes:     Conjunctiva/sclera: Conjunctivae normal.  Cardiovascular:     Rate and Rhythm: Normal rate and regular rhythm.     Heart sounds: Murmur heard.     Comments: 2+ radial pulse bilaterally 2+ dorsalis pedis pulse bilaterally Pulmonary:     Effort: No respiratory distress.     Breath sounds: Normal breath sounds.     Comments: Increased respiratory rate  Lungs clear to auscultation bilaterally Abdominal:     Palpations: Abdomen is soft.     Tenderness: There is no abdominal tenderness.  Musculoskeletal:        General: No swelling.     Cervical back: Neck supple.     Comments: Very mild edema of the bilateral lower extremities around the ankle, nonpitting  Skin:    General: Skin is warm and dry.     Capillary Refill: Capillary refill takes less than 2 seconds.  Neurological:  Mental Status: He is alert.  Psychiatric:        Mood and Affect: Mood normal.     ED Results / Procedures / Treatments   Labs (all labs ordered are listed, but only abnormal results are displayed) Labs Reviewed  COMPREHENSIVE METABOLIC PANEL WITH GFR - Abnormal; Notable for the following components:      Result Value   CO2 18 (*)    Glucose, Bld 114 (*)    Creatinine, Ser 1.35 (*)    Alkaline Phosphatase 127 (*)    Total Bilirubin 2.0 (*)    Anion gap 16 (*)    All other components within normal limits  PRO BRAIN NATRIURETIC PEPTIDE - Abnormal; Notable for the following components:   Pro Brain Natriuretic Peptide 4,180.0 (*)    All other components within normal limits  CBC WITH DIFFERENTIAL/PLATELET - Abnormal; Notable for the following components:   RDW 17.4 (*)     All other components within normal limits    EKG EKG Interpretation Date/Time:  Wednesday July 25 2023 20:10:58 EDT Ventricular Rate:  88 PR Interval:  157 QRS Duration:  96 QT Interval:  373 QTC Calculation: 452 R Axis:   12  Text Interpretation: Sinus rhythm Biatrial enlargement Left ventricular hypertrophy ST elevation, consider inferior injury when compared top rior, more wandering baseline No STEMI Confirmed by Wynell Heath ([id number]) on 07/25/2023 8:39:03 PM  Radiology DG Chest 2 View Result Date: 07/25/2023 CLINICAL DATA:  Shortness of breath EXAM: CHEST - 2 VIEW COMPARISON:  11/17/2021 FINDINGS: Cardiomegaly. Mild diffuse interstitial and ground-glass opacity. No pleural effusion. No consolidation. IMPRESSION: Cardiomegaly with mild diffuse interstitial and ground-glass opacity suggesting edema. Electronically Signed   By: Esmeralda Hedge M.D.   On: 07/25/2023 21:11    Procedures Procedures  {Document cardiac monitor, telemetry assessment procedure when appropriate:1}  Medications Ordered in ED Medications - No data to display  ED Course/ Medical Decision Making/ A&P   {   Click here for ABCD2, HEART and other calculatorsREFRESH Note before signing :1}                              Medical Decision Making Amount and/or Complexity of Data Reviewed Labs: ordered. Radiology: ordered.  Risk Prescription drug management. Decision regarding hospitalization.     Differential diagnosis includes but is not limited to CHF, COPD, pneumonia, URI, thyroid abnormality, arrhythmia  ED Course:  Upon initial evaluation, patient is well-appearing, but with increased respiratory rate in the 20s.  He is able to talk in full sentences on room air.  Lungs clear to auscultation bilaterally.  He reports some lower extremity edema, very mild nonpitting edema in the ankles bilaterally.  Labs Ordered: I Ordered, and personally interpreted labs.  The pertinent results include:      Imaging Studies ordered: I ordered imaging studies including ***  I independently visualized the imaging with scope of interpretation limited to determining acute life threatening conditions related to emergency care. Imaging showed *** I agree with the radiologist interpretation   Cardiac Monitoring: / EKG: The patient was maintained on a cardiac monitor.  I personally viewed and interpreted the cardiac monitored which showed an underlying rhythm of: ***   Consultations Obtained: I requested consultation with the Cardiology Dr. Lonny sullivan ***,  and discussed lab and imaging findings as well as pertinent plan - they recommend: *** [facility]  Medications Given: ***  Upon re-evaluation, patient ***.  Impression: ***  Disposition:  {AF ED Dispo:29713} Return precautions given.    Record Review: External records from outside source obtained and reviewed including ***     This chart was dictated using voice recognition software, Dragon. Despite the best efforts of this provider to proofread and correct errors, errors may still occur which can change documentation meaning.    {Document critical care time when appropriate:1} {Document review of labs and clinical decision tools ie heart score, Chads2Vasc2 etc:1}  {Document your independent review of radiology images, and any outside records:1} {Document your discussion with family members, caretakers, and with consultants:1} {Document social determinants of health affecting pt's care:1} {Document your decision making why or why not admission, treatments were needed:1} Final Clinical Impression(s) / ED Diagnoses Final diagnoses:  None    Rx / DC Orders ED Discharge Orders     None

## 2023-07-25 NOTE — ED Notes (Signed)
Pt placed on 2 L. 

## 2023-07-25 NOTE — ED Triage Notes (Signed)
 Pt reports SHOB x 2 weeks; no hx of lung problems; sts he is having to sleep propped up; worse with exertion

## 2023-07-25 NOTE — ED Provider Notes (Signed)
 [place] [facility] Provider Note   CSN: [id number] Arrival date & time: 07/25/23  1959     History {Add pertinent medical, surgical, social history, OB history to HPI:1} Chief Complaint  Patient presents with  . Shortness of Breath    Martin Keller is a 58 y.o. male.   Shortness of Breath      Home Medications Prior to Admission medications   Medication Sig Start Date End Date Taking? Authorizing Provider  carvedilol  (COREG ) 3.125 MG tablet TAKE 1 TABLET BY MOUTH TWICE DAILY WITH A MEAL 05/19/22   Elmarie Hacking, FNP  ENTRESTO  49-51 MG TAKE 1 TABLET BY MOUTH TWICE DAILY 05/19/22   Elmarie Hacking, FNP  furosemide  (LASIX ) 40 MG tablet Take 1 tablet (40 mg total) by mouth daily. 02/22/21   Elmarie Hacking, FNP  JARDIANCE  10 MG TABS tablet TAKE 1 TABLET(10 MG) BY MOUTH DAILY 05/19/22   Milford, Arlice Bene, FNP  potassium chloride  SA (KLOR-CON ) 20 MEQ tablet Take 1 tablet (20 mEq total) by mouth daily for 3 days. 02/22/21   Milford, Arlice Bene, FNP  rosuvastatin  (CRESTOR ) 10 MG tablet Take 1 tablet (10 mg total) by mouth at bedtime. 02/22/21   Elmarie Hacking, FNP  spironolactone  (ALDACTONE ) 25 MG tablet Take 1 tablet (25 mg total) by mouth daily. 02/22/21   Elmarie Hacking, FNP      Allergies    Patient has no known allergies.    Review of Systems   Review of Systems  Respiratory:  Positive for shortness of breath.     Physical Exam Updated Vital Signs BP (!) 143/101 (BP Location: Left Arm)   Pulse 87   Temp 97.8 F (36.6 C) (Oral)   Resp (!) 28   Ht 5\' 10"  (1.778 m)   Wt 74.8 kg   SpO2 99%   BMI 23.68 kg/m  Physical Exam  ED Results / Procedures / Treatments   Labs (all labs ordered are listed, but only abnormal results are displayed) Labs Reviewed  COMPREHENSIVE METABOLIC PANEL WITH GFR - Abnormal; Notable for the following components:      Result Value   CO2 18 (*)    Glucose, Bld 114 (*)    Creatinine,  Ser 1.35 (*)    Alkaline Phosphatase 127 (*)    Total Bilirubin 2.0 (*)    Anion gap 16 (*)    All other components within normal limits  PRO BRAIN NATRIURETIC PEPTIDE - Abnormal; Notable for the following components:   Pro Brain Natriuretic Peptide 4,180.0 (*)    All other components within normal limits  CBC WITH DIFFERENTIAL/PLATELET - Abnormal; Notable for the following components:   RDW 17.4 (*)    All other components within normal limits    EKG EKG Interpretation Date/Time:  Wednesday July 25 2023 20:10:58 EDT Ventricular Rate:  88 PR Interval:  157 QRS Duration:  96 QT Interval:  373 QTC Calculation: 452 R Axis:   12  Text Interpretation: Sinus rhythm Biatrial enlargement Left ventricular hypertrophy ST elevation, consider inferior injury when compared top rior, more wandering baseline No STEMI Confirmed by Wynell Heath ([id number]) on 07/25/2023 8:39:03 PM  Radiology DG Chest 2 View Result Date: 07/25/2023 CLINICAL DATA:  Shortness of breath EXAM: CHEST - 2 VIEW COMPARISON:  11/17/2021 FINDINGS: Cardiomegaly. Mild diffuse interstitial and ground-glass opacity. No pleural effusion. No consolidation. IMPRESSION: Cardiomegaly with mild diffuse interstitial and ground-glass opacity suggesting edema. Electronically Signed   By: Burdette Carolin  Cyndy Dresser M.D.   On: 07/25/2023 21:11    Procedures Procedures  {Document cardiac monitor, telemetry assessment procedure when appropriate:1}  Medications Ordered in ED Medications - No data to display  ED Course/ Medical Decision Making/ A&P   {   Click here for ABCD2, HEART and other calculatorsREFRESH Note before signing :1}                              Medical Decision Making Amount and/or Complexity of Data Reviewed Labs: ordered. Radiology: ordered.     Differential diagnosis includes but is not limited to ***  ED Course:  Upon initial evaluation, patient is ***  Labs Ordered: I Ordered, and personally interpreted labs.  The  pertinent results include:  ***  Imaging Studies ordered: I ordered imaging studies including ***  I independently visualized the imaging with scope of interpretation limited to determining acute life threatening conditions related to emergency care. Imaging showed *** I agree with the radiologist interpretation   Cardiac Monitoring: / EKG: The patient was maintained on a cardiac monitor.  I personally viewed and interpreted the cardiac monitored which showed an underlying rhythm of: ***   Consultations Obtained: I requested consultation with the Cardiology Dr. Lonny sullivan ***,  and discussed lab and imaging findings as well as pertinent plan - they recommend: *** [facility]  Medications Given: ***  Upon re-evaluation, patient ***.      Impression: ***  Disposition:  {AF ED Dispo:29713} Return precautions given.    Record Review: External records from outside source obtained and reviewed including ***     This chart was dictated using voice recognition software, Dragon. Despite the best efforts of this provider to proofread and correct errors, errors may still occur which can change documentation meaning.    {Document critical care time when appropriate:1} {Document review of labs and clinical decision tools ie heart score, Chads2Vasc2 etc:1}  {Document your independent review of radiology images, and any outside records:1} {Document your discussion with family members, caretakers, and with consultants:1} {Document social determinants of health affecting pt's care:1} {Document your decision making why or why not admission, treatments were needed:1} Final Clinical Impression(s) / ED Diagnoses Final diagnoses:  None    Rx / DC Orders ED Discharge Orders     None

## 2023-07-26 ENCOUNTER — Encounter (HOSPITAL_COMMUNITY): Payer: Self-pay | Admitting: Cardiology

## 2023-07-26 DIAGNOSIS — Z7984 Long term (current) use of oral hypoglycemic drugs: Secondary | ICD-10-CM | POA: Diagnosis not present

## 2023-07-26 DIAGNOSIS — R0602 Shortness of breath: Secondary | ICD-10-CM | POA: Diagnosis present

## 2023-07-26 DIAGNOSIS — I5023 Acute on chronic systolic (congestive) heart failure: Secondary | ICD-10-CM | POA: Diagnosis present

## 2023-07-26 DIAGNOSIS — I340 Nonrheumatic mitral (valve) insufficiency: Secondary | ICD-10-CM | POA: Diagnosis present

## 2023-07-26 DIAGNOSIS — Z91148 Patient's other noncompliance with medication regimen for other reason: Secondary | ICD-10-CM | POA: Diagnosis not present

## 2023-07-26 DIAGNOSIS — Z833 Family history of diabetes mellitus: Secondary | ICD-10-CM | POA: Diagnosis not present

## 2023-07-26 DIAGNOSIS — Z8249 Family history of ischemic heart disease and other diseases of the circulatory system: Secondary | ICD-10-CM | POA: Diagnosis not present

## 2023-07-26 DIAGNOSIS — Z79899 Other long term (current) drug therapy: Secondary | ICD-10-CM | POA: Diagnosis not present

## 2023-07-26 DIAGNOSIS — I10 Essential (primary) hypertension: Secondary | ICD-10-CM

## 2023-07-26 DIAGNOSIS — Z634 Disappearance and death of family member: Secondary | ICD-10-CM | POA: Diagnosis not present

## 2023-07-26 DIAGNOSIS — I420 Dilated cardiomyopathy: Secondary | ICD-10-CM | POA: Diagnosis present

## 2023-07-26 DIAGNOSIS — F17290 Nicotine dependence, other tobacco product, uncomplicated: Secondary | ICD-10-CM | POA: Diagnosis present

## 2023-07-26 DIAGNOSIS — E785 Hyperlipidemia, unspecified: Secondary | ICD-10-CM | POA: Diagnosis present

## 2023-07-26 DIAGNOSIS — K761 Chronic passive congestion of liver: Secondary | ICD-10-CM | POA: Diagnosis present

## 2023-07-26 DIAGNOSIS — Z556 Problems related to health literacy: Secondary | ICD-10-CM | POA: Diagnosis not present

## 2023-07-26 DIAGNOSIS — E1151 Type 2 diabetes mellitus with diabetic peripheral angiopathy without gangrene: Secondary | ICD-10-CM | POA: Diagnosis present

## 2023-07-26 DIAGNOSIS — I110 Hypertensive heart disease with heart failure: Secondary | ICD-10-CM | POA: Diagnosis present

## 2023-07-26 LAB — CBC
HCT: 44.6 % (ref 39.0–52.0)
Hemoglobin: 14.2 g/dL (ref 13.0–17.0)
MCH: 27.4 pg (ref 26.0–34.0)
MCHC: 31.8 g/dL (ref 30.0–36.0)
MCV: 86.1 fL (ref 80.0–100.0)
Platelets: 179 10*3/uL (ref 150–400)
RBC: 5.18 MIL/uL (ref 4.22–5.81)
RDW: 17.2 % — ABNORMAL HIGH (ref 11.5–15.5)
WBC: 8.4 10*3/uL (ref 4.0–10.5)
nRBC: 0 % (ref 0.0–0.2)

## 2023-07-26 LAB — COMPREHENSIVE METABOLIC PANEL WITH GFR
ALT: 46 U/L — ABNORMAL HIGH (ref 0–44)
AST: 47 U/L — ABNORMAL HIGH (ref 15–41)
Albumin: 3.7 g/dL (ref 3.5–5.0)
Alkaline Phosphatase: 104 U/L (ref 38–126)
Anion gap: 12 (ref 5–15)
BUN: 23 mg/dL — ABNORMAL HIGH (ref 6–20)
CO2: 22 mmol/L (ref 22–32)
Calcium: 9.2 mg/dL (ref 8.9–10.3)
Chloride: 102 mmol/L (ref 98–111)
Creatinine, Ser: 1.51 mg/dL — ABNORMAL HIGH (ref 0.61–1.24)
GFR, Estimated: 53 mL/min — ABNORMAL LOW (ref >60)
Glucose, Bld: 133 mg/dL — ABNORMAL HIGH (ref 70–99)
Potassium: 4.3 mmol/L (ref 3.5–5.1)
Sodium: 136 mmol/L (ref 135–145)
Total Bilirubin: 2.4 mg/dL — ABNORMAL HIGH (ref 0.0–1.2)
Total Protein: 7.6 g/dL (ref 6.5–8.1)

## 2023-07-26 LAB — HIV ANTIBODY (ROUTINE TESTING W REFLEX): HIV Screen 4th Generation wRfx: NONREACTIVE

## 2023-07-26 LAB — LACTIC ACID, PLASMA: Lactic Acid, Venous: 1 mmol/L (ref 0.5–1.9)

## 2023-07-26 MED ORDER — ASPIRIN 81 MG PO CHEW
81.0000 mg | CHEWABLE_TABLET | Freq: Every day | ORAL | Status: DC
  Administered 2023-07-26 – 2023-07-27 (×2): 81 mg via ORAL
  Filled 2023-07-26 (×2): qty 1

## 2023-07-26 MED ORDER — SODIUM CHLORIDE 0.9% FLUSH
3.0000 mL | Freq: Two times a day (BID) | INTRAVENOUS | Status: DC
  Administered 2023-07-26 – 2023-07-27 (×2): 3 mL via INTRAVENOUS

## 2023-07-26 MED ORDER — FUROSEMIDE 10 MG/ML IJ SOLN
80.0000 mg | Freq: Once | INTRAMUSCULAR | Status: AC
  Administered 2023-07-26: 80 mg via INTRAVENOUS
  Filled 2023-07-26: qty 8

## 2023-07-26 MED ORDER — ROSUVASTATIN CALCIUM 20 MG PO TABS
20.0000 mg | ORAL_TABLET | Freq: Every day | ORAL | Status: DC
  Administered 2023-07-26 – 2023-07-27 (×2): 20 mg via ORAL
  Filled 2023-07-26 (×2): qty 1

## 2023-07-26 MED ORDER — ACETAMINOPHEN 325 MG PO TABS: 650.0000 mg | ORAL_TABLET | ORAL | Status: DC | PRN

## 2023-07-26 MED ORDER — EMPAGLIFLOZIN 10 MG PO TABS
10.0000 mg | ORAL_TABLET | Freq: Every day | ORAL | Status: DC
  Administered 2023-07-26 – 2023-07-27 (×2): 10 mg via ORAL
  Filled 2023-07-26 (×2): qty 1

## 2023-07-26 MED ORDER — SODIUM CHLORIDE 0.9% FLUSH: 3.0000 mL | INTRAVENOUS | Status: DC | PRN

## 2023-07-26 MED ORDER — POTASSIUM CHLORIDE CRYS ER 20 MEQ PO TBCR
40.0000 meq | EXTENDED_RELEASE_TABLET | Freq: Once | ORAL | Status: AC
  Administered 2023-07-26: 40 meq via ORAL
  Filled 2023-07-26: qty 2

## 2023-07-26 MED ORDER — SODIUM CHLORIDE 0.9 % IV SOLN: 250.0000 mL | INTRAVENOUS | Status: DC | PRN

## 2023-07-26 MED ORDER — SACUBITRIL-VALSARTAN 24-26 MG PO TABS
1.0000 | ORAL_TABLET | Freq: Two times a day (BID) | ORAL | Status: DC
  Administered 2023-07-26 – 2023-07-27 (×2): 1 via ORAL
  Filled 2023-07-26 (×2): qty 1

## 2023-07-26 MED ORDER — HEPARIN SODIUM (PORCINE) 5000 UNIT/ML IJ SOLN
5000.0000 [IU] | Freq: Three times a day (TID) | INTRAMUSCULAR | Status: DC
  Administered 2023-07-26: 5000 [IU] via SUBCUTANEOUS
  Filled 2023-07-26 (×2): qty 1

## 2023-07-26 NOTE — TOC CM/SW Note (Signed)
 Transition of Care [facility]) - Inpatient Brief Assessment   Patient Details  Name: Martin Keller MRN: [id number] Date of Birth: 11-02-1965  [facility]) CM/SW Contact:    Arron Big, LCSWA Phone Number: 07/26/2023, 2:48 PM   Clinical Narrative: CSW met patient at bedside and introduced self/role. Patient states he lives at home with the mother of his baby. Patient reports that he now has reliable transportation. Patient reports no DME, no prior hx of HH or SNF. Patient states he uses [facility] pharmacy on [address].    Patient stated he had Medicaid and is trying to obtain a new insurance card. CSW provided patient with member services number for [facility]. CSW reached out to Financial Counseling to check if patient has medicaid.  TOC will continue to follow as needed.      Transition of Care Asessment: Insurance and Status: Insurance coverage has been reviewed Patient has primary care physician: Yes Home environment has been reviewed: Patient stated he lives with the mother of his child Prior level of function:: Independent Prior/Current Home Services: No current home services Social Drivers of Health Review: SDOH reviewed no interventions necessary Readmission risk has been reviewed: No Transition of care needs: no transition of care needs at this time

## 2023-07-26 NOTE — ED Notes (Signed)
 Called [facility] for transport to [facility] @12 :10.  Spoke with [employer]

## 2023-07-26 NOTE — Consult Note (Deleted)
 Advanced Heart Failure Team Consult Note   Primary Physician: Patient, No Pcp Per Cardiologist:  Lauro Portal, MD  Reason for Consultation: Acute on chronic HFrEF  HPI:    Martin Keller is seen today for evaluation of acute on chronic HFrEF at the request of Emergency Medicine.   Martin Keller is a 58 y.o. male with a hx of HTN, PAD s/p jetstream atherectomy/DCB left SFA in 2020, tobacco use, chronic HFrEF, severe MR, DM II.    He was admitted 12/19 with acute systolic HF. Echo showed EF 30-35% and severe MR. R/LHC showed normal coronaries EF 20-25% and severe 3-4+ MR. TEE EF 25% severe functional MR.   Echo 11/22: EF 25-30%, RV okay, severe MR  He was previously followed by Dr. Bensimhon in [facility] but has not been seen since 2022. He later established with Cardiology at [facility] in 2023. Saw EP in 09/23 for consideration of ICD pending Medicaid.  Has not been seen Cardiology since then.  He presented to [facility] ED 07/25/23 with complaints of worsening dyspnea, orthopnea, decreased appetite and lower extremity edema X 2 weeks. Has been out of his medications for at least a couple of weeks. Developed worsening symptoms afterwards. Labs significant for proBNP > 4,000, Scr 1.35, T bili 2.0, CO2 18, lactic acid okay. SR on ECG. He was given 80 mg lasix  IV and transferred to [facility] for management of acute on chronic CHF.  Continues to work full time at a [employer]. Has trouble keeping up with work related duties. Lives with the mother of his children, trying to save up to get his own place.  Denies recent ETOH use, continues with tobacco and marijuana use. No other drug use.  Home Medications Prior to Admission medications   Medication Sig Start Date End Date Taking? Authorizing Provider  carvedilol  (COREG ) 3.125 MG tablet TAKE 1 TABLET BY MOUTH TWICE DAILY WITH A MEAL 05/19/22   Elmarie Hacking, FNP  ENTRESTO  49-51 MG TAKE 1 TABLET BY MOUTH TWICE DAILY 05/19/22   Elmarie Hacking, FNP  furosemide  (LASIX ) 40 MG tablet Take 1 tablet (40 mg total) by mouth daily. 02/22/21   Elmarie Hacking, FNP  JARDIANCE  10 MG TABS tablet TAKE 1 TABLET(10 MG) BY MOUTH DAILY 05/19/22   Milford, Arlice Bene, FNP  potassium chloride  SA (KLOR-CON ) 20 MEQ tablet Take 1 tablet (20 mEq total) by mouth daily for 3 days. 02/22/21   Elmarie Hacking, FNP  rosuvastatin  (CRESTOR ) 10 MG tablet Take 1 tablet (10 mg total) by mouth at bedtime. 02/22/21   Elmarie Hacking, FNP  spironolactone  (ALDACTONE ) 25 MG tablet Take 1 tablet (25 mg total) by mouth daily. 02/22/21   Elmarie Hacking, FNP    Past Medical History: Past Medical History:  Diagnosis Date   Hyperlipidemia    Hypertension     Past Surgical History: Past Surgical History:  Procedure Laterality Date   ABDOMINAL AORTOGRAM W/LOWER EXTREMITY N/A 03/10/2019   Procedure: ABDOMINAL AORTOGRAM W/LOWER EXTREMITY;  Surgeon: Adine Hoof, MD;  Location: [facility] INVASIVE CV LAB;  Service: Cardiovascular;  Laterality: N/A;   PERIPHERAL VASCULAR ATHERECTOMY Left 03/10/2019   Procedure: PERIPHERAL VASCULAR ATHERECTOMY;  Surgeon: Adine Hoof, MD;  Location: [facility] INVASIVE CV LAB;  Service: Cardiovascular;  Laterality: Left;  SFA   PERIPHERAL VASCULAR BALLOON ANGIOPLASTY Left 03/10/2019   Procedure: PERIPHERAL VASCULAR BALLOON ANGIOPLASTY;  Surgeon: Adine Hoof, MD;  Location: [facility] INVASIVE CV LAB;  Service: Cardiovascular;  Laterality: Left;  SFA   RIGHT/LEFT HEART CATH AND CORONARY ANGIOGRAPHY N/A 03/05/2018   Procedure: RIGHT/LEFT HEART CATH AND CORONARY ANGIOGRAPHY;  Surgeon: Mardell Shade, MD;  Location: [facility] INVASIVE CV LAB;  Service: Cardiovascular;  Laterality: N/A;   TEE WITHOUT CARDIOVERSION N/A 03/07/2018   Procedure: TRANSESOPHAGEAL ECHOCARDIOGRAM (TEE);  Surgeon: Darlis Eisenmenger, MD;  Location: [facility] ENDOSCOPY;  Service: Cardiovascular;  Laterality: N/A;    Family History: Family History  Problem  Relation Age of Onset   Heart attack Mother        Died at age of 17 from heart attack.   Heart attack Father        Died at age of 20 from heart attack.   Heart disease Brother        Heart surgery at age of 110.   Diabetes Child     Social History: Social History   Socioeconomic History   Marital status: Single    Spouse name: Not on file   Number of children: Not on file   Years of education: Not on file   Highest education level: Not on file  Occupational History   Not on file  Tobacco Use   Smoking status: Every Day    Types: Cigars   Smokeless tobacco: Never  Vaping Use   Vaping status: Never Used  Substance and Sexual Activity   Alcohol use: Yes   Drug use: Yes    Types: Marijuana   Sexual activity: Yes    Partners: Female    Birth control/protection: None  Other Topics Concern   Not on file  Social History Narrative   Not on file   Social Drivers of Health   Financial Resource Strain: Not on file  Food Insecurity: No Food Insecurity (07/26/2023)   Hunger Vital Sign    Worried About Running Out of Food in the Last Year: Never true    Ran Out of Food in the Last Year: Never true  Transportation Needs: No Transportation Needs (07/26/2023)   PRAPARE - Administrator, Civil Service (Medical): No    Lack of Transportation (Non-Medical): No  Physical Activity: Not on file  Stress: Not on file  Social Connections: Socially Integrated (07/26/2023)   Social Connection and Isolation Panel [NHANES]    Frequency of Communication with Friends and Family: More than three times a week    Frequency of Social Gatherings with Friends and Family: More than three times a week    Attends Religious Services: More than 4 times per year    Active Member of Clubs or Organizations: Patient unable to answer    Attends Engineer, structural: More than 4 times per year    Marital Status: Living with partner    Allergies:  No Known Allergies  Objective:     Vital Signs:   Temp:  [97.8 F (36.6 C)-98 F (36.7 C)] 97.9 F (36.6 C) (04/24 1341) Pulse Rate:  [63-87] 79 (04/24 1341) Resp:  [15-45] 18 (04/24 1341) BP: (120-143)/(80-101) 132/92 (04/24 1341) SpO2:  [86 %-100 %] 98 % (04/24 1341) Weight:  [74.2 kg-74.8 kg] 74.2 kg (04/24 1340) Last BM Date : 07/25/23  Weight change: Filed Weights   07/25/23 2018 07/26/23 1340  Weight: 74.8 kg 74.2 kg    Intake/Output:   Intake/Output Summary (Last 24 hours) at 07/26/2023 1527 Last data filed at 07/26/2023 1200 Gross per 24 hour  Intake --  Output 3150 ml  Net -3150 ml  Physical Exam    General:  Chronically ill appearing Neck: JVP to midneck Cor: Regular rate & rhythm. No rubs, gallops, 2/6 systolic murmur Lungs: diminished in bases Abdomen: soft, nontender, + distended.  Extremities: no edema Neuro: alert & orientedx3. Affect pleasant   Telemetry   SR 70s-80s, occasional PVCs   Labs   Basic Metabolic Panel: Recent Labs  Lab 07/25/23 2020  NA 137  K 4.4  CL 103  CO2 18*  GLUCOSE 114*  BUN 20  CREATININE 1.35*  CALCIUM  9.8    Liver Function Tests: Recent Labs  Lab 07/25/23 2020  AST 41  ALT 33  ALKPHOS 127*  BILITOT 2.0*  PROT 7.9  ALBUMIN 4.4   No results for input(s): "LIPASE", "AMYLASE" in the last 168 hours. No results for input(s): "AMMONIA" in the last 168 hours.  CBC: Recent Labs  Lab 07/25/23 2020  WBC 9.0  NEUTROABS 4.7  HGB 13.6  HCT 42.5  MCV 85.0  PLT 185    Cardiac Enzymes: No results for input(s): "CKTOTAL", "CKMB", "CKMBINDEX", "TROPONINI" in the last 168 hours.  BNP: BNP (last 3 results) No results for input(s): "BNP" in the last 8760 hours.  ProBNP (last 3 results) Recent Labs    07/25/23 2020  PROBNP 4,180.0*     CBG: No results for input(s): "GLUCAP" in the last 168 hours.  Coagulation Studies: No results for input(s): "LABPROT", "INR" in the last 72 hours.   Imaging   DG Chest 2 View Result  Date: 07/25/2023 CLINICAL DATA:  Shortness of breath EXAM: CHEST - 2 VIEW COMPARISON:  11/17/2021 FINDINGS: Cardiomegaly. Mild diffuse interstitial and ground-glass opacity. No pleural effusion. No consolidation. IMPRESSION: Cardiomegaly with mild diffuse interstitial and ground-glass opacity suggesting edema. Electronically Signed   By: Esmeralda Hedge M.D.   On: 07/25/2023 21:11     Medications:     Current Medications:   Infusions:     Patient Profile   58 y.o. male with history of PAD, HTN, HFrEF/NICM, severe MR, DM II, tobacco use, noncompliance. Admitted with acute on chronic CHF after running out of medications.  Assessment/Plan   1. Acute on chronic systolic HF - LHC normal coronaries 12/19.  - Etiology unclear - possibly HTN or ETOH. Has not had cardiac MRI - Echo 11/22: EF 25-30%, LVIDd 6.7 cm, RV okay, severe MR - NYHA IIIb/IV - Still has some volume on board. Give another 80 mg lasix  IV - Has been off home medications. Restart Entresto  at 24/26 mg BID and Farxiga 10 mg daily - Will hold off on restarting beta blocker with concern he is end-stage - Update echo - Worry that he is now end-stage. May need to consider advanced therapies but has not been to follow-ups d/t transportation issues. Tobacco use would prohibit transplant but may consider workup for LVAD.   2. Severe MR - Severe MR on last echo in 2022 - Functional MR - Repeat echo   3. HTN - BP elevated - Meds as above   4. Tobacco use - Discussed cessation  5. PAD - s/p jetstream atherectomy/DCB left SFA in 2020 - Restart aspirin  and statin  6. DM II - Last A1c was 6.8 - Start Jardiance  - SSI  SDOH: Poor health literacy -Reports he has medicaid. -Works full-time but may need disability. -Transportation an issue -HF TOC CSW/CM consulted   Length of Stay: 0  Shadi Larner N, PA-C  07/26/2023, 3:27 PM    Advanced Heart Failure Team Pager [phone] (M-F;  7a - 5p)  Please contact [facility] for night-coverage after hours (4p -7a ) and weekends on [email or web address]

## 2023-07-26 NOTE — Plan of Care (Signed)

## 2023-07-26 NOTE — Plan of Care (Signed)
  Problem: Education: Goal: Knowledge of General Education information will improve Description: Including pain rating scale, medication(s)/side effects and non-pharmacologic comfort measures 07/26/2023 1416 by Brenton Cambridge, RN Outcome: Progressing 07/26/2023 1416 by Brenton Cambridge, RN Outcome: Progressing   Problem: Health Behavior/Discharge Planning: Goal: Ability to manage health-related needs will improve 07/26/2023 1416 by Brenton Cambridge, RN Outcome: Progressing 07/26/2023 1416 by Brenton Cambridge, RN Outcome: Progressing   Problem: Clinical Measurements: Goal: Ability to maintain clinical measurements within normal limits will improve 07/26/2023 1416 by Brenton Cambridge, RN Outcome: Progressing 07/26/2023 1416 by Brenton Cambridge, RN Outcome: Progressing Goal: Will remain free from infection 07/26/2023 1416 by Brenton Cambridge, RN Outcome: Progressing 07/26/2023 1416 by Brenton Cambridge, RN Outcome: Progressing Goal: Diagnostic test results will improve 07/26/2023 1416 by Brenton Cambridge, RN Outcome: Progressing 07/26/2023 1416 by Brenton Cambridge, RN Outcome: Progressing Goal: Respiratory complications will improve 07/26/2023 1416 by Brenton Cambridge, RN Outcome: Progressing 07/26/2023 1416 by Brenton Cambridge, RN Outcome: Progressing Goal: Cardiovascular complication will be avoided 07/26/2023 1416 by Brenton Cambridge, RN Outcome: Progressing 07/26/2023 1416 by Brenton Cambridge, RN Outcome: Progressing   Problem: Activity: Goal: Risk for activity intolerance will decrease 07/26/2023 1416 by Brenton Cambridge, RN Outcome: Progressing 07/26/2023 1416 by Brenton Cambridge, RN Outcome: Progressing   Problem: Nutrition: Goal: Adequate nutrition will be maintained 07/26/2023 1416 by Brenton Cambridge, RN Outcome: Progressing 07/26/2023 1416 by Brenton Cambridge, RN Outcome: Progressing   Problem: Coping: Goal: Level of anxiety will decrease 07/26/2023 1416 by Brenton Cambridge, RN Outcome: Progressing 07/26/2023 1416 by Brenton Cambridge, RN Outcome: Progressing   Problem: Elimination: Goal: Will not experience complications related to bowel motility 07/26/2023 1416 by Brenton Cambridge, RN Outcome: Progressing 07/26/2023 1416 by Brenton Cambridge, RN Outcome: Progressing Goal: Will not experience complications related to urinary retention 07/26/2023 1416 by Brenton Cambridge, RN Outcome: Progressing 07/26/2023 1416 by Brenton Cambridge, RN Outcome: Progressing   Problem: Pain Managment: Goal: General experience of comfort will improve and/or be controlled 07/26/2023 1416 by Brenton Cambridge, RN Outcome: Progressing 07/26/2023 1416 by Brenton Cambridge, RN Outcome: Progressing   Problem: Safety: Goal: Ability to remain free from injury will improve 07/26/2023 1416 by Brenton Cambridge, RN Outcome: Progressing 07/26/2023 1416 by Brenton Cambridge, RN Outcome: Progressing   Problem: Skin Integrity: Goal: Risk for impaired skin integrity will decrease 07/26/2023 1416 by Brenton Cambridge, RN Outcome: Progressing 07/26/2023 1416 by Brenton Cambridge, RN Outcome: Progressing

## 2023-07-26 NOTE — H&P (Addendum)
 Advanced Heart Failure Team Consult Note   Primary Physician: Patient, No Pcp Per Cardiologist:  Lauro Portal, MD HF: Dr. Julane Ny  Reason for Consultation: Acute on chronic HFrEF  HPI:    Martin Keller is seen today for evaluation of acute on chronic HFrEF at the request of Emergency Medicine.   Martin Keller is a 58 y.o. male with a hx of HTN, PAD s/p jetstream atherectomy/DCB left SFA in 2020, tobacco use, chronic HFrEF, severe MR, DM II.    He was admitted 12/19 with acute systolic HF. Echo showed EF 30-35% and severe MR. R/LHC showed normal coronaries EF 20-25% and severe 3-4+ MR. TEE EF 25% severe functional MR.   Echo 11/22: EF 25-30%, RV okay, severe MR  He was previously followed by Dr. Bensimhon in [facility] but has not been seen since 2022. He later established with Cardiology at [facility] in 2023. Saw EP in 09/23 for consideration of ICD pending Medicaid.  Has not been seen Cardiology since then.  He presented to [facility] ED 07/25/23 with complaints of worsening dyspnea, orthopnea, decreased appetite and lower extremity edema X 2 weeks. Has been out of his medications for at least a couple of weeks. Developed worsening symptoms afterwards. Labs significant for proBNP > 4,000, Scr 1.35, T bili 2.0, CO2 18, lactic acid okay. SR on ECG. He was given 80 mg lasix  IV and transferred to [facility] for management of acute on chronic CHF.  Continues to work full time at a [employer]. Has trouble keeping up with work related duties. Lives with the mother of his children, trying to save up to get his own place.  Denies recent ETOH use, continues with tobacco and marijuana use. No other drug use.  Home Medications Prior to Admission medications   Medication Sig Start Date End Date Taking? Authorizing Provider  carvedilol  (COREG ) 3.125 MG tablet TAKE 1 TABLET BY MOUTH TWICE DAILY WITH A MEAL 05/19/22   Elmarie Hacking, FNP  ENTRESTO  49-51 MG TAKE 1 TABLET BY MOUTH TWICE DAILY 05/19/22    Milford, Arlice Bene, FNP  furosemide  (LASIX ) 40 MG tablet Take 1 tablet (40 mg total) by mouth daily. 02/22/21   Elmarie Hacking, FNP  JARDIANCE  10 MG TABS tablet TAKE 1 TABLET(10 MG) BY MOUTH DAILY 05/19/22   Milford, Arlice Bene, FNP  potassium chloride  SA (KLOR-CON ) 20 MEQ tablet Take 1 tablet (20 mEq total) by mouth daily for 3 days. 02/22/21   Elmarie Hacking, FNP  rosuvastatin  (CRESTOR ) 10 MG tablet Take 1 tablet (10 mg total) by mouth at bedtime. 02/22/21   Elmarie Hacking, FNP  spironolactone  (ALDACTONE ) 25 MG tablet Take 1 tablet (25 mg total) by mouth daily. 02/22/21   Elmarie Hacking, FNP    Past Medical History: Past Medical History:  Diagnosis Date   Hyperlipidemia    Hypertension     Past Surgical History: Past Surgical History:  Procedure Laterality Date   ABDOMINAL AORTOGRAM W/LOWER EXTREMITY N/A 03/10/2019   Procedure: ABDOMINAL AORTOGRAM W/LOWER EXTREMITY;  Surgeon: Adine Hoof, MD;  Location: [facility] INVASIVE CV LAB;  Service: Cardiovascular;  Laterality: N/A;   PERIPHERAL VASCULAR ATHERECTOMY Left 03/10/2019   Procedure: PERIPHERAL VASCULAR ATHERECTOMY;  Surgeon: Adine Hoof, MD;  Location: [facility] INVASIVE CV LAB;  Service: Cardiovascular;  Laterality: Left;  SFA   PERIPHERAL VASCULAR BALLOON ANGIOPLASTY Left 03/10/2019   Procedure: PERIPHERAL VASCULAR BALLOON ANGIOPLASTY;  Surgeon: Adine Hoof, MD;  Location: [facility] INVASIVE CV LAB;  Service: Cardiovascular;  Laterality: Left;  SFA   RIGHT/LEFT HEART CATH AND CORONARY ANGIOGRAPHY N/A 03/05/2018   Procedure: RIGHT/LEFT HEART CATH AND CORONARY ANGIOGRAPHY;  Surgeon: Mardell Shade, MD;  Location: [facility] INVASIVE CV LAB;  Service: Cardiovascular;  Laterality: N/A;   TEE WITHOUT CARDIOVERSION N/A 03/07/2018   Procedure: TRANSESOPHAGEAL ECHOCARDIOGRAM (TEE);  Surgeon: Darlis Eisenmenger, MD;  Location: [facility] ENDOSCOPY;  Service: Cardiovascular;  Laterality: N/A;    Family History: Family  History  Problem Relation Age of Onset   Heart attack Mother        Died at age of 58 from heart attack.   Heart attack Father        Died at age of 81 from heart attack.   Heart disease Brother        Heart surgery at age of 36.   Diabetes Child     Social History: Social History   Socioeconomic History   Marital status: Single    Spouse name: Not on file   Number of children: Not on file   Years of education: Not on file   Highest education level: Not on file  Occupational History   Not on file  Tobacco Use   Smoking status: Every Day    Types: Cigars   Smokeless tobacco: Never  Vaping Use   Vaping status: Never Used  Substance and Sexual Activity   Alcohol use: Yes   Drug use: Yes    Types: Marijuana   Sexual activity: Yes    Partners: Female    Birth control/protection: None  Other Topics Concern   Not on file  Social History Narrative   Not on file   Social Drivers of Health   Financial Resource Strain: Not on file  Food Insecurity: No Food Insecurity (07/26/2023)   Hunger Vital Sign    Worried About Running Out of Food in the Last Year: Never true    Ran Out of Food in the Last Year: Never true  Transportation Needs: No Transportation Needs (07/26/2023)   PRAPARE - Administrator, Civil Service (Medical): No    Lack of Transportation (Non-Medical): No  Physical Activity: Not on file  Stress: Not on file  Social Connections: Socially Integrated (07/26/2023)   Social Connection and Isolation Panel [NHANES]    Frequency of Communication with Friends and Family: More than three times a week    Frequency of Social Gatherings with Friends and Family: More than three times a week    Attends Religious Services: More than 4 times per year    Active Member of Clubs or Organizations: Patient unable to answer    Attends Engineer, structural: More than 4 times per year    Marital Status: Living with partner    Allergies:  No Known  Allergies  Objective:    Vital Signs:   Temp:  [97.8 F (36.6 C)-98 F (36.7 C)] 98 F (36.7 C) (04/24 1602) Pulse Rate:  [63-87] 73 (04/24 1602) Resp:  [15-45] 19 (04/24 1602) BP: (120-143)/(80-101) 132/92 (04/24 1341) SpO2:  [86 %-100 %] 98 % (04/24 1341) Weight:  [74.2 kg-74.8 kg] 74.2 kg (04/24 1340) Last BM Date : 07/25/23  Weight change: Filed Weights   07/25/23 2018 07/26/23 1340  Weight: 74.8 kg 74.2 kg    Intake/Output:   Intake/Output Summary (Last 24 hours) at 07/26/2023 1728 Last data filed at 07/26/2023 1200 Gross per 24 hour  Intake --  Output 3150 ml  Net -3150  ml      Physical Exam    General:  Chronically ill appearing Neck: JVP to midneck Cor: Regular rate & rhythm. No rubs, gallops, 2/6 systolic murmur Lungs: diminished in bases Abdomen: soft, nontender, + distended.  Extremities: no edema Neuro: alert & orientedx3. Affect pleasant   Telemetry   SR 70s-80s, occasional PVCs   Labs   Basic Metabolic Panel: Recent Labs  Lab 07/25/23 2020  NA 137  K 4.4  CL 103  CO2 18*  GLUCOSE 114*  BUN 20  CREATININE 1.35*  CALCIUM  9.8    Liver Function Tests: Recent Labs  Lab 07/25/23 2020  AST 41  ALT 33  ALKPHOS 127*  BILITOT 2.0*  PROT 7.9  ALBUMIN 4.4   No results for input(s): "LIPASE", "AMYLASE" in the last 168 hours. No results for input(s): "AMMONIA" in the last 168 hours.  CBC: Recent Labs  Lab 07/25/23 2020  WBC 9.0  NEUTROABS 4.7  HGB 13.6  HCT 42.5  MCV 85.0  PLT 185    Cardiac Enzymes: No results for input(s): "CKTOTAL", "CKMB", "CKMBINDEX", "TROPONINI" in the last 168 hours.  BNP: BNP (last 3 results) No results for input(s): "BNP" in the last 8760 hours.  ProBNP (last 3 results) Recent Labs    07/25/23 2020  PROBNP 4,180.0*     CBG: No results for input(s): "GLUCAP" in the last 168 hours.  Coagulation Studies: No results for input(s): "LABPROT", "INR" in the last 72 hours.   Imaging   DG  Chest 2 View Result Date: 07/25/2023 CLINICAL DATA:  Shortness of breath EXAM: CHEST - 2 VIEW COMPARISON:  11/17/2021 FINDINGS: Cardiomegaly. Mild diffuse interstitial and ground-glass opacity. No pleural effusion. No consolidation. IMPRESSION: Cardiomegaly with mild diffuse interstitial and ground-glass opacity suggesting edema. Electronically Signed   By: Esmeralda Hedge M.D.   On: 07/25/2023 21:11     Medications:     Current Medications:  aspirin   81 mg Oral Daily   empagliflozin   10 mg Oral Daily   rosuvastatin   20 mg Oral Daily   sacubitril -valsartan   1 tablet Oral BID    Infusions:     Patient Profile   58 y.o. male with history of PAD, HTN, HFrEF/NICM, severe MR, DM II, tobacco use, noncompliance. Admitted with acute on chronic CHF after running out of medications.  Assessment/Plan   1. Acute on chronic systolic HF - LHC normal coronaries 12/19.  - Etiology unclear - possibly HTN or ETOH. Has not had cardiac MRI - Echo 11/22: EF 25-30%, LVIDd 6.7 cm, RV okay, severe MR - NYHA IIIb/IV - Still has some volume on board. Give another 80 mg lasix  IV - Has been off home medications. Restart Entresto  at 24/26 mg BID and Farxiga 10 mg daily - Will hold off on restarting beta blocker with concern he is end-stage - Update echo - Worry that he is now end-stage. May need to consider advanced therapies but has not been to follow-ups d/t transportation issues. Tobacco use would prohibit transplant but may consider workup for LVAD.   2. Severe MR - Severe MR on last echo in 2022 - Functional MR - Repeat echo   3. HTN - BP elevated - Meds as above   4. Tobacco use - Discussed cessation  5. PAD - s/p jetstream atherectomy/DCB left SFA in 2020 - Restart aspirin  and statin  6. DM II - Last A1c was 6.8 - Start Jardiance  - SSI  SDOH: Poor health literacy -Reports he has medicaid. -  Works full-time but may need disability. -Transportation an issue -HF TOC CSW/CM  consulted   Length of Stay: 0  Macyn Shropshire N, PA-C  07/26/2023, 5:28 PM    Advanced Heart Failure Team Pager [phone] (M-F; 7a - 5p)  Please contact [facility] for night-coverage after hours (4p -7a ) and weekends on [email or web address]

## 2023-07-26 NOTE — Plan of Care (Signed)
 ?  Problem: Clinical Measurements: ?Goal: Respiratory complications will improve ?Outcome: Progressing ?  ?Problem: Activity: ?Goal: Risk for activity intolerance will decrease ?Outcome: Progressing ?  ?Problem: Coping: ?Goal: Level of anxiety will decrease ?Outcome: Progressing ?  ?Problem: Elimination: ?Goal: Will not experience complications related to urinary retention ?Outcome: Progressing ?  ?

## 2023-07-27 ENCOUNTER — Other Ambulatory Visit (HOSPITAL_COMMUNITY): Payer: Self-pay

## 2023-07-27 ENCOUNTER — Inpatient Hospital Stay (HOSPITAL_COMMUNITY)

## 2023-07-27 ENCOUNTER — Telehealth (HOSPITAL_COMMUNITY): Payer: Self-pay | Admitting: Pharmacy Technician

## 2023-07-27 DIAGNOSIS — I5023 Acute on chronic systolic (congestive) heart failure: Secondary | ICD-10-CM | POA: Diagnosis not present

## 2023-07-27 LAB — BASIC METABOLIC PANEL WITH GFR
Anion gap: 13 (ref 5–15)
BUN: 31 mg/dL — ABNORMAL HIGH (ref 6–20)
CO2: 25 mmol/L (ref 22–32)
Calcium: 9.4 mg/dL (ref 8.9–10.3)
Chloride: 100 mmol/L (ref 98–111)
Creatinine, Ser: 1.59 mg/dL — ABNORMAL HIGH (ref 0.61–1.24)
GFR, Estimated: 50 mL/min — ABNORMAL LOW (ref >60)
Glucose, Bld: 150 mg/dL — ABNORMAL HIGH (ref 70–99)
Potassium: 3.9 mmol/L (ref 3.5–5.1)
Sodium: 138 mmol/L (ref 135–145)

## 2023-07-27 LAB — ECHOCARDIOGRAM COMPLETE
AR max vel: 2.49 cm2
AV Peak grad: 6.1 mmHg
Ao pk vel: 1.23 m/s
Area-P 1/2: 4.96 cm2
Est EF: 25
Height: 70 in
MV M vel: 5.39 m/s
MV Peak grad: 116.2 mmHg
MV VTI: 1.21 cm2
Radius: 0.8 cm
S' Lateral: 6.6 cm
Weight: 2617.3 [oz_av]

## 2023-07-27 LAB — MAGNESIUM: Magnesium: 2.1 mg/dL (ref 1.7–2.4)

## 2023-07-27 MED ORDER — SACUBITRIL-VALSARTAN 24-26 MG PO TABS
1.0000 | ORAL_TABLET | Freq: Two times a day (BID) | ORAL | 5 refills | Status: DC
Start: 2023-07-27 — End: 2023-08-31
  Filled 2023-07-27: qty 60, 30d supply, fill #0

## 2023-07-27 MED ORDER — SPIRONOLACTONE 25 MG PO TABS
25.0000 mg | ORAL_TABLET | Freq: Every day | ORAL | Status: DC
  Administered 2023-07-27: 25 mg via ORAL
  Filled 2023-07-27: qty 1

## 2023-07-27 MED ORDER — FUROSEMIDE 10 MG/ML IJ SOLN
60.0000 mg | Freq: Once | INTRAMUSCULAR | Status: AC
Start: 2023-07-27 — End: 2023-07-27
  Administered 2023-07-27: 60 mg via INTRAVENOUS
  Filled 2023-07-27: qty 6

## 2023-07-27 MED ORDER — SPIRONOLACTONE 25 MG PO TABS
25.0000 mg | ORAL_TABLET | Freq: Every day | ORAL | 5 refills | Status: DC
  Filled 2023-07-27: qty 30, 30d supply, fill #0

## 2023-07-27 MED ORDER — FUROSEMIDE 40 MG PO TABS
40.0000 mg | ORAL_TABLET | Freq: Every day | ORAL | Status: DC
Start: 2023-07-28 — End: 2023-07-27

## 2023-07-27 MED ORDER — POTASSIUM CHLORIDE CRYS ER 20 MEQ PO TBCR
20.0000 meq | EXTENDED_RELEASE_TABLET | Freq: Once | ORAL | Status: AC
  Administered 2023-07-27: 20 meq via ORAL
  Filled 2023-07-27: qty 1

## 2023-07-27 MED ORDER — ASPIRIN 81 MG PO CHEW
81.0000 mg | CHEWABLE_TABLET | Freq: Every day | ORAL | 5 refills | Status: DC
  Filled 2023-07-27: qty 30, 30d supply, fill #0

## 2023-07-27 MED ORDER — ROSUVASTATIN CALCIUM 20 MG PO TABS
20.0000 mg | ORAL_TABLET | Freq: Every day | ORAL | 5 refills | Status: DC
  Filled 2023-07-27: qty 30, 30d supply, fill #0

## 2023-07-27 MED ORDER — EMPAGLIFLOZIN 10 MG PO TABS
10.0000 mg | ORAL_TABLET | Freq: Every day | ORAL | 5 refills | Status: DC
  Filled 2023-07-27: qty 30, 30d supply, fill #0

## 2023-07-27 MED ORDER — FUROSEMIDE 40 MG PO TABS
40.0000 mg | ORAL_TABLET | Freq: Every day | ORAL | 5 refills | Status: DC
  Filled 2023-07-27: qty 30, 30d supply, fill #0

## 2023-07-27 NOTE — Discharge Summary (Signed)
 Advanced Heart Failure Team  Discharge Summary   Patient ID: Martin Keller MRN: [id number], DOB/AGE: December 21, 1965 58 y.o. Admit date: 07/25/2023 D/C date:     07/27/2023   Primary Discharge Diagnoses:  Acute on chronic systolic heart failure Severe MR HTN Tobacco abuse PAD DMII SDOH  Hospital Course:  Martin Keller is a 58 y.o. male with a hx of HTN, PAD s/p jetstream atherectomy/DCB left SFA in 2020, tobacco use, chronic HFrEF, severe MR, DM II.    Admitted 12/19 with acute systolic HF. Echo: EF 30-35% and severe MR. R/LHC showed normal coronaries EF 20-25% and severe 3-4+ MR. TEE EF 25% severe functional MR. Echo 11/22: EF 25-30%, RV okay, severe MR   He was previously followed by Dr. Fusaye Wachtel in [facility] but has not been seen since 2022. He later established with Cardiology at [facility] in 2023. Saw EP 09/23 for consideration of ICD pending Medicaid.  Has not been seeing Cardiology since then.  Presented to [facility] with worsening dyspnea, orthopnea, decreased appetite and BLE edema. Had been out of his medications for several weeks. Echo this admission with EF 25%, GIIIDD, RV mildly reduced, LA severely dilated, RA mildly dilated, Mod-severe MR. Functional MR with dilated annulus. Trivial TR. He diuresed very well with IV lasix . GDMT was able to be restarted. Plan to further up titrate outpatient. We are worried that he is end-stage now    Pt will continue to be followed closely in the HF clinic. Dr Julane Ny evaluated and deemed appropriate for discharge.   See below for detailed problem list: 1. Acute on chronic systolic HF - LHC normal coronaries 12/19.  - Etiology unclear - possibly HTN or ETOH. Has not had cardiac MRI - Echo 11/22: EF 25-30%, LVIDd 6.7 cm, RV okay, severe MR - Echo 07/27/23: EF 25%, GIIIDD, RV mildly reduced, LA severely dilated, RA mildly dilated, Mod-severe MR. Functional MR with dilated annulus. Trivial TR.  - NYHA IIIb/IV - Appears euvolemic today. Start  lasix  40 mg daily tomorrow.  - Continue Entresto  24/26 mg BID  - Start spiro 25 mg daily - Continue Jardiance  10 mg daily - Will hold off on restarting beta blocker with concern he is end-stage - Worry that he is now end-stage. May need to consider advanced therapies but has not been to follow-ups d/t transportation issues. Tobacco use would prohibit transplant but may consider workup for LVAD. 2. Severe MR - Severe MR on last echo in 2022 - Functional MR - Mod-severe MR on echo today. Functional MR with dilated annulus.  3. HTN - BP improving - Meds as above 4. Tobacco use - Discussed cessation 5. PAD - s/p jetstream atherectomy/DCB left SFA in 2020 - Continue aspirin  and statin 6. DM II - Last A1c was 6.8 - Continue Jardiance  - SSI   SDOH: Poor health literacy -Reports he has medicaid. -Works full-time but may need disability. -Transportation an issue -HF TOC CSW/CM consulted   Discharge Weight Range: 74.2 kg Discharge Vitals: Blood pressure 121/83, pulse 68, temperature 98.3 F (36.8 C), temperature source Oral, resp. rate 20, height 5\' 10"  (1.778 m), weight 74.2 kg, SpO2 98%.  Labs: Lab Results  Component Value Date   WBC 8.4 07/26/2023   HGB 14.2 07/26/2023   HCT 44.6 07/26/2023   MCV 86.1 07/26/2023   PLT 179 07/26/2023    Recent Labs  Lab 07/26/23 1741 07/27/23 0309  NA 136 138  K 4.3 3.9  CL 102 100  CO2 22 25  BUN  23* 31*  CREATININE 1.51* 1.59*  CALCIUM  9.2 9.4  PROT 7.6  --   BILITOT 2.4*  --   ALKPHOS 104  --   ALT 46*  --   AST 47*  --   GLUCOSE 133* 150*   Lab Results  Component Value Date   CHOL 169 03/03/2018   HDL 33 (L) 03/03/2018   LDLCALC 118 (H) 03/03/2018   TRIG 91 03/03/2018   BNP (last 3 results) No results for input(s): "BNP" in the last 8760 hours.  ProBNP (last 3 results) Recent Labs    07/25/23 2020  PROBNP 4,180.0*     Diagnostic Studies/Procedures   ECHOCARDIOGRAM COMPLETE Result Date: 07/27/2023     ECHOCARDIOGRAM REPORT   Patient Name:   Martin Keller Date of Exam: 07/27/2023 Medical Rec #:  [id number]     Height:       70.0 in Accession #:    [id number]    Weight:       163.6 lb Date of Birth:  [birth year]     BSA:          1.916 m Patient Age:    57 years      BP:           110/66 mmHg Patient Gender: M             HR:           62 bpm. Exam Location:  Inpatient Procedure: 2D Echo, Cardiac Doppler and Color Doppler (Both Spectral and Color            Flow Doppler were utilized during procedure). Indications:    Congestive heart failure  History:        Patient has prior history of Echocardiogram examinations. CHF;                 Risk Factors:Current Smoker and Hypertension.  Sonographer:    Willey Harrier Referring Phys: [id number] LINDSAY NICOLE FINCH IMPRESSIONS  1. Left ventricular ejection fraction, by estimation, is 25%. The left ventricle has severely decreased function. The left ventricle demonstrates global hypokinesis. The left ventricular internal cavity size was severely dilated. Left ventricular diastolic parameters are consistent with Grade III diastolic dysfunction (restrictive). Elevated left atrial pressure.  2. Right ventricular systolic function is mildly reduced. The right ventricular size is mildly enlarged. There is normal pulmonary artery systolic pressure. The estimated right ventricular systolic pressure is 18.1 mmHg.  3. Left atrial size was severely dilated.  4. Right atrial size was mildly dilated.  5. The mitral valve is abnormal. Moderate to severe mitral valve regurgitation. Functional MR with dilated annulus. MR is not fully visualized by color doppler but very thick CW doppler signal suggests at least moderate-severe MR. No evidence of mitral stenosis.  6. The aortic valve is tricuspid. Aortic valve regurgitation is not visualized. No aortic stenosis is present.  7. The inferior vena cava is normal in size with greater than 50% respiratory variability, suggesting right atrial pressure  of 3 mmHg (surprising given restrictive diastolic filling). FINDINGS  Left Ventricle: Left ventricular ejection fraction, by estimation, is 25%. The left ventricle has severely decreased function. The left ventricle demonstrates global hypokinesis. The left ventricular internal cavity size was severely dilated. There is no left ventricular hypertrophy. Left ventricular diastolic parameters are consistent with Grade III diastolic dysfunction (restrictive). Elevated left atrial pressure. Right Ventricle: The right ventricular size is mildly enlarged. No increase in right ventricular wall thickness. Right ventricular systolic function is  mildly reduced. There is normal pulmonary artery systolic pressure. The tricuspid regurgitant velocity  is 1.94 m/s, and with an assumed right atrial pressure of 3 mmHg, the estimated right ventricular systolic pressure is 18.1 mmHg. Left [facility]: Left atrial size was severely dilated. Right [facility]: Right atrial size was mildly dilated. Pericardium: There is no evidence of pericardial effusion. Mitral Valve: The mitral valve is abnormal. There is mild calcification of the mitral valve leaflet(s). Mild mitral annular calcification. Moderate to severe mitral valve regurgitation. No evidence of mitral valve stenosis. MV peak gradient, 12.7 mmHg. The mean mitral valve gradient is 3.0 mmHg. Tricuspid Valve: The tricuspid valve is normal in structure. Tricuspid valve regurgitation is trivial. Aortic Valve: The aortic valve is tricuspid. Aortic valve regurgitation is not visualized. No aortic stenosis is present. Aortic valve peak gradient measures 6.1 mmHg. Pulmonic Valve: The pulmonic valve was normal in structure. Pulmonic valve regurgitation is trivial. Aorta: The aortic root is normal in size and structure. Venous: The inferior vena cava is normal in size with greater than 50% respiratory variability, suggesting right atrial pressure of 3 mmHg. IAS/Shunts: No atrial level shunt detected  by color flow Doppler.  LEFT VENTRICLE PLAX 2D LVIDd:         7.10 cm   Diastology LVIDs:         6.60 cm   LV e' medial:    5.11 cm/s LV PW:         0.80 cm   LV E/e' medial:  30.1 LV IVS:        0.80 cm   LV e' lateral:   6.20 cm/s LVOT diam:     1.90 cm   LV E/e' lateral: 24.8 LV SV:         50 LV SV Index:   26 LVOT Area:     2.84 cm  RIGHT VENTRICLE             IVC RV Basal diam:  5.10 cm     IVC diam: 2.00 cm RV S prime:     10.30 cm/s TAPSE (M-mode): 1.8 cm LEFT ATRIUM              Index        RIGHT ATRIUM           Index LA diam:        5.00 cm  2.61 cm/m   RA Area:     21.10 cm LA Vol (A2C):   179.0 ml 93.40 ml/m  RA Volume:   60.80 ml  31.73 ml/m LA Vol (A4C):   117.0 ml 61.05 ml/m LA Biplane Vol: 149.0 ml 77.75 ml/m  AORTIC VALVE AV Area (Vmax): 2.49 cm AV Vmax:        123.00 cm/s AV Peak Grad:   6.1 mmHg LVOT Vmax:      108.00 cm/s LVOT Vmean:     74.400 cm/s LVOT VTI:       0.176 m  AORTA Ao Root diam: 3.10 cm Ao Asc diam:  3.10 cm MITRAL VALVE                 TRICUSPID VALVE MV Area (PHT): 4.96 cm      TR Peak grad:   15.1 mmHg MV Area VTI:   1.21 cm      TR Vmax:        194.00 cm/s MV Peak grad:  12.7 mmHg MV Mean grad:  3.0 mmHg  SHUNTS MV Vmax:       1.78 m/s      Systemic VTI:  0.18 m MV Vmean:      67.7 cm/s     Systemic Diam: 1.90 cm MV Decel Time: 153 msec MR Peak grad:   116.2 mmHg MR Mean grad:   72.0 mmHg MR Vmax:        539.00 cm/s MR Vmean:       394.0 cm/s MR PISA:        4.02 cm MR PISA Radius: 0.80 cm MV E velocity: 154.00 cm/s MV A velocity: 31.00 cm/s MV E/A ratio:  4.97 Dalton McleanMD Electronically signed by Archer Bear Signature Date/Time: 07/27/2023/9:22:42 AM    Final    DG Chest 2 View Result Date: 07/25/2023 CLINICAL DATA:  Shortness of breath EXAM: CHEST - 2 VIEW COMPARISON:  11/17/2021 FINDINGS: Cardiomegaly. Mild diffuse interstitial and ground-glass opacity. No pleural effusion. No consolidation. IMPRESSION: Cardiomegaly with mild diffuse interstitial  and ground-glass opacity suggesting edema. Electronically Signed   By: Esmeralda Hedge M.D.   On: 07/25/2023 21:11    Discharge Medications   Allergies as of 07/27/2023   No Known Allergies      Medication List     STOP taking these medications    carvedilol  3.125 MG tablet Commonly known as: COREG    Entresto  49-51 MG Generic drug: sacubitril -valsartan  Replaced by: sacubitril -valsartan  24-26 MG   potassium chloride  SA 20 MEQ tablet Commonly known as: KLOR-CON  M       TAKE these medications    aspirin  81 MG chewable tablet Chew 1 tablet (81 mg total) by mouth daily. Start taking on: July 28, 2023   empagliflozin  10 MG Tabs tablet Commonly known as: Jardiance  Take 1 tablet (10 mg total) by mouth daily. What changed: See the new instructions.   furosemide  40 MG tablet Commonly known as: LASIX  Take 1 tablet (40 mg total) by mouth daily.   rosuvastatin  20 MG tablet Commonly known as: CRESTOR  Take 1 tablet (20 mg total) by mouth daily. Start taking on: July 28, 2023 What changed:  medication strength how much to take when to take this   sacubitril -valsartan  24-26 MG Commonly known as: ENTRESTO  Take 1 tablet by mouth 2 (two) times daily. Replaces: Entresto  49-51 MG   spironolactone  25 MG tablet Commonly known as: ALDACTONE  Take 1 tablet (25 mg total) by mouth daily.        Disposition   The patient will be discharged in stable condition to home.   Follow-up Information     [facility] Follow up on 08/03/2023.   Specialty: Cardiology Why: Follow up in the Advanced Heart Failure Clinic 08/03/23 at 9:30 Entrance C, free valet Contact information: [address] [place]  [phone]                  Duration of Discharge Encounter: Greater than 35 minutes   Signed, Sheryl Donna AGACNP-BC  07/27/2023, 11:59 AM   Patient seen and examined with the above-signed  Advanced Practice Provider and/or Housestaff. I personally reviewed laboratory data, imaging studies and relevant notes. I independently examined the patient and formulated the important aspects of the plan. I have edited the note to reflect any of my changes or salient points. I have personally discussed the plan with the patient and/or family.  See my rounding note from earlier today.   He is improved. Stable for d/c. Echo EF 25%  with mod-severe MR.   Encouraged compliance with meds and f/u in HF Clinic  Med access d/w PharmD  MD time  Jules Oar, MD  12:54 PM

## 2023-07-27 NOTE — TOC Progression Note (Signed)
 Transition of Care [facility]) - Progression Note    Patient Details  Name: Martin Keller MRN: [id number] Date of Birth: [birth year]  [facility]) CM/SW Contact  Arron Big, [place] Phone Number: 07/27/2023, 8:43 AM  Clinical Narrative:   FC was able to find and add patients medicaid to his patient account.   TOC will continue to follow.         Expected Discharge Plan and Services                                               Social Determinants of Health (SDOH) Interventions SDOH Screenings   Food Insecurity: No Food Insecurity (07/26/2023)  Housing: Low Risk  (07/26/2023)  Transportation Needs: No Transportation Needs (07/26/2023)  Utilities: Not At Risk (07/26/2023)  Social Connections: Socially Integrated (07/26/2023)  Tobacco Use: High Risk (07/26/2023)    Readmission Risk Interventions     No data to display

## 2023-07-27 NOTE — Plan of Care (Signed)

## 2023-07-27 NOTE — Plan of Care (Signed)
  Problem: Education: Goal: Knowledge of General Education information will improve Description: Including pain rating scale, medication(s)/side effects and non-pharmacologic comfort measures 07/27/2023 1319 by Brenton Cambridge, RN Outcome: Adequate for Discharge 07/27/2023 1137 by Brenton Cambridge, RN Outcome: Progressing   Problem: Health Behavior/Discharge Planning: Goal: Ability to manage health-related needs will improve 07/27/2023 1319 by Brenton Cambridge, RN Outcome: Adequate for Discharge 07/27/2023 1137 by Brenton Cambridge, RN Outcome: Progressing   Problem: Clinical Measurements: Goal: Ability to maintain clinical measurements within normal limits will improve 07/27/2023 1319 by Brenton Cambridge, RN Outcome: Adequate for Discharge 07/27/2023 1137 by Brenton Cambridge, RN Outcome: Progressing Goal: Will remain free from infection 07/27/2023 1319 by Brenton Cambridge, RN Outcome: Adequate for Discharge 07/27/2023 1137 by Brenton Cambridge, RN Outcome: Progressing Goal: Diagnostic test results will improve 07/27/2023 1319 by Brenton Cambridge, RN Outcome: Adequate for Discharge 07/27/2023 1137 by Brenton Cambridge, RN Outcome: Progressing Goal: Respiratory complications will improve 07/27/2023 1319 by Brenton Cambridge, RN Outcome: Adequate for Discharge 07/27/2023 1137 by Brenton Cambridge, RN Outcome: Progressing Goal: Cardiovascular complication will be avoided 07/27/2023 1319 by Brenton Cambridge, RN Outcome: Adequate for Discharge 07/27/2023 1137 by Brenton Cambridge, RN Outcome: Progressing   Problem: Activity: Goal: Risk for activity intolerance will decrease 07/27/2023 1319 by Brenton Cambridge, RN Outcome: Adequate for Discharge 07/27/2023 1137 by Brenton Cambridge, RN Outcome: Progressing   Problem: Nutrition: Goal: Adequate nutrition will be maintained 07/27/2023 1319 by Brenton Cambridge, RN Outcome: Adequate for Discharge 07/27/2023 1137 by Brenton Cambridge, RN Outcome:  Progressing   Problem: Coping: Goal: Level of anxiety will decrease 07/27/2023 1319 by Brenton Cambridge, RN Outcome: Adequate for Discharge 07/27/2023 1137 by Brenton Cambridge, RN Outcome: Progressing   Problem: Elimination: Goal: Will not experience complications related to bowel motility 07/27/2023 1319 by Brenton Cambridge, RN Outcome: Adequate for Discharge 07/27/2023 1137 by Brenton Cambridge, RN Outcome: Progressing Goal: Will not experience complications related to urinary retention 07/27/2023 1319 by Brenton Cambridge, RN Outcome: Adequate for Discharge 07/27/2023 1137 by Brenton Cambridge, RN Outcome: Progressing   Problem: Pain Managment: Goal: General experience of comfort will improve and/or be controlled 07/27/2023 1319 by Brenton Cambridge, RN Outcome: Adequate for Discharge 07/27/2023 1137 by Brenton Cambridge, RN Outcome: Progressing   Problem: Safety: Goal: Ability to remain free from injury will improve 07/27/2023 1319 by Brenton Cambridge, RN Outcome: Adequate for Discharge 07/27/2023 1137 by Brenton Cambridge, RN Outcome: Progressing   Problem: Skin Integrity: Goal: Risk for impaired skin integrity will decrease 07/27/2023 1319 by Brenton Cambridge, RN Outcome: Adequate for Discharge 07/27/2023 1137 by Brenton Cambridge, RN Outcome: Progressing

## 2023-07-27 NOTE — Progress Notes (Addendum)
 Advanced Heart Failure Rounding Note  Cardiologist: Lauro Portal, MD  Chief Complaint: Acute on chronic systolic heart failure Subjective:    -5L UOP yesterday with 80 IV lasix  x1.   SCr 1.51>1.59  Feels good this morning. Has been walking around in his room.   Objective:   Weight Range: 74.2 kg Body mass index is 23.47 kg/m.   Vital Signs:   Temp:  [97.5 F (36.4 C)-98.3 F (36.8 C)] 98.3 F (36.8 C) (04/25 0749) Pulse Rate:  [63-79] 68 (04/25 0749) Resp:  [18-31] 20 (04/25 0749) BP: (110-138)/(66-94) 121/83 (04/25 0749) SpO2:  [88 %-100 %] 98 % (04/25 0749) Weight:  [74.2 kg] 74.2 kg (04/25 0454) Last BM Date : 07/26/23  Weight change: Filed Weights   07/25/23 2018 07/26/23 1340 07/27/23 0454  Weight: 74.8 kg 74.2 kg 74.2 kg    Intake/Output:   Intake/Output Summary (Last 24 hours) at 07/27/2023 0817 Last data filed at 07/27/2023 0752 Gross per 24 hour  Intake 480 ml  Output 5375 ml  Net -4895 ml    Physical Exam  General:  Well appearing.  No respiratory difficulty Neck: supple. JVD ~6 cm.  Cor: PMI nondisplaced. Regular rate & rhythm. No rubs, gallops. 2/6 systolic murmur Lungs: crackles to LLL otherwise clear Extremities: no cyanosis, clubbing, rash, edema  Neuro: alert & oriented x 3. Moves all 4 extremities w/o difficulty. Affect pleasant.   Telemetry   SB 50s (Personally reviewed)    EKG    No new EKG to review  Labs    CBC Recent Labs    07/25/23 2020 07/26/23 1741  WBC 9.0 8.4  NEUTROABS 4.7  --   HGB 13.6 14.2  HCT 42.5 44.6  MCV 85.0 86.1  PLT 185 179   Basic Metabolic Panel Recent Labs    57/84/69 1741 07/27/23 0309  NA 136 138  K 4.3 3.9  CL 102 100  CO2 22 25  GLUCOSE 133* 150*  BUN 23* 31*  CREATININE 1.51* 1.59*  CALCIUM  9.2 9.4  MG  --  2.1   Liver Function Tests Recent Labs    07/25/23 2020 07/26/23 1741  AST 41 47*  ALT 33 46*  ALKPHOS 127* 104  BILITOT 2.0* 2.4*  PROT 7.9 7.6  ALBUMIN 4.4  3.7   No results for input(s): "LIPASE", "AMYLASE" in the last 72 hours. Cardiac Enzymes No results for input(s): "CKTOTAL", "CKMB", "CKMBINDEX", "TROPONINI" in the last 72 hours.  BNP: BNP (last 3 results) No results for input(s): "BNP" in the last 8760 hours.  ProBNP (last 3 results) Recent Labs    07/25/23 2020  PROBNP 4,180.0*     D-Dimer No results for input(s): "DDIMER" in the last 72 hours. Hemoglobin A1C No results for input(s): "HGBA1C" in the last 72 hours. Fasting Lipid Panel No results for input(s): "CHOL", "HDL", "LDLCALC", "TRIG", "CHOLHDL", "LDLDIRECT" in the last 72 hours. Thyroid Function Tests No results for input(s): "TSH", "T4TOTAL", "T3FREE", "THYROIDAB" in the last 72 hours.  Invalid input(s): "FREET3"  Other results:   Imaging    No results found.   Medications:     Scheduled Medications:  aspirin   81 mg Oral Daily   empagliflozin   10 mg Oral Daily   heparin   5,000 Units Subcutaneous Q8H   rosuvastatin   20 mg Oral Daily   sacubitril -valsartan   1 tablet Oral BID   sodium chloride  flush  3 mL Intravenous Q12H    Infusions:  sodium chloride   PRN Medications: sodium chloride , acetaminophen , sodium chloride  flush    Patient Profile   58 y.o. male with history of PAD, HTN, HFrEF/NICM, severe MR, DM II, tobacco use, noncompliance. Admitted with acute on chronic CHF after running out of medications.   Assessment/Plan   1. Acute on chronic systolic HF - LHC normal coronaries 12/19.  - Etiology unclear - possibly HTN or ETOH. Has not had cardiac MRI - Echo 11/22: EF 25-30%, LVIDd 6.7 cm, RV okay, severe MR - NYHA IIIb/IV - Appears euvolemic today. Would hold off on further lasix .  - Continue Entresto  24/26 mg BID  - Start spiro 12.5 mg daily - Continue Jardiance  10 mg daily - Will hold off on restarting beta blocker with concern he is end-stage - Update echo - Worry that he is now end-stage. May need to consider advanced  therapies but has not been to follow-ups d/t transportation issues. Tobacco use would prohibit transplant but may consider workup for LVAD.   2. Severe MR - Severe MR on last echo in 2022 - Functional MR - Repeat echo   3. HTN - BP improving - Meds as above   4. Tobacco use - Discussed cessation   5. PAD - s/p jetstream atherectomy/DCB left SFA in 2020 - Continue aspirin  and statin   6. DM II - Last A1c was 6.8 - Continue Jardiance  - SSI   SDOH: Poor health literacy -Reports he has medicaid. -Works full-time but may need disability. -Transportation an issue -HF TOC CSW/CM consulted  Length of Stay: 1  Sheryl Donna, NP  07/27/2023, 8:17 AM  Advanced Heart Failure Team Pager [phone] (M-F; 7a - 5p)  Please contact [facility] for night-coverage after hours (5p -7a ) and weekends on [email or web address]  Patient seen and examined with the above-signed Advanced Practice Provider and/or Housestaff. I personally reviewed laboratory data, imaging studies and relevant notes. I independently examined the patient and formulated the important aspects of the plan. I have edited the note to reflect any of my changes or salient points. I have personally discussed the plan with the patient and/or family.  Has diuresed well. Breathing much better. No orthopnea or PND  General:  Lying flat in bed  No resp difficulty HEENT: normal Neck: supple. JVP 7-8  Carotids 2+ bilat; no bruits. No lymphadenopathy or thryomegaly appreciated. Cor: PMI nondisplaced. Regular rate & rhythm. 2/6 MR Lungs: clear Abdomen: soft, nontender, nondistended. No hepatosplenomegaly. No bruits or masses. Good bowel sounds. Extremities: no cyanosis, clubbing, rash, edema mildly cool Neuro: alert & orientedx3, cranial nerves grossly intact. moves all 4 extremities w/o difficulty. Affect pleasant  Much improved after diuresis. Still with minimal volume overloaded. Will give one more dose of IV lasix  and arrange d/c with  close f/u in HF Clinic.   Jules Oar, MD  12:51 PM

## 2023-07-27 NOTE — Telephone Encounter (Signed)
Patient Product/process development scientist completed.    The patient is insured through [facility].     Ran test claim for Entresto 24-26 mgand the current 30 day co-pay is $4.00.  Ran test claim for Jardiance 10 mg and the current 30 day co-pay is $4.00.  Ran test claim for Farxiga 10 mg and Requires Prior Authorization  This test claim was processed through [employer]- copay amounts may vary at other pharmacies due to [employer], or as the patient moves through the different stages of their insurance plan.     Roland Earl, CPHT Pharmacy Technician III Certified Patient Advocate [facility] Patient Advocate Team Direct Number: [phone]  Fax: [phone]

## 2023-08-02 NOTE — Progress Notes (Addendum)
 Advanced Heart Failure Clinic Note   PCP: Patient, No Pcp Per PCP-Cardiologist: Lauro Portal, MD  HF: Dr. Julane Ny  HPI: Martin Keller is a 58 y.o. male with a hx of HTN, tobacco use, systolic HF (diagnosed 03/2018), and severe MR (diagnosed 12/19).    He was admitted 12/19 with acute systolic HF. Echo showed EF 30-35% and severe MR. R/LHC showed normal coronaries EF 20-25% and severe 3-4+ MR. TEE EF 25% severe functional MR.   Follow up with PharmD 12/21 and he was still only taking Entresto  and carvedilol  once a day. He was given a bill box and started on Jardiance  10 mg daily.  Echo 11/22: EF 25-30%, LVIDd 6.7 cm, RV okay, severe MR  Admitted to [facility] 4/25 with worsening dyspnea, orthopnea, decreased appetite and BLE edema. Transferred to [facility]. Had been out of his medications for several weeks. Echo with EF 25%, GIIIDD, RV mildly reduced, LA severely dilated, RA mildly dilated, Mod-severe MR. Functional MR with dilated annulus. Trivial TR. He diuresed very well with IV lasix . GDMT was able to be restarted. Plan to further up titrate outpatient. We are worried that he is end-stage now   Today he returns for post hospital follow up. Overall feeling ok just gets short of breath with activity. Denies palpitations, CP, dizziness, edema, or PND/Orthopnea. SOB with prolonged exertion. + bendopnea. Appetite ok, has been eating out a lot. No fever or chills. Does not weight at home. Taking all medications except he has only been taking Entresto  once daily. Denies ETOH or drug use. Smokes 1 black and mild a day. Marijuana daily. Stacks wood for work.   Cardiac Studies Reviewed:  Echo 07/27/23: EF 25%, GIIIDD, RV mildly reduced, LA severely dilated, RA mildly dilated, Mod-severe MR. Functional MR with dilated annulus. Trivial TR. Echo 11/22: EF 25-30%, LVIDd 6.7 cm, RV okay, severe MR Echo 11/2018: EF ~30%  Echo 03/04/18: EF 30-35%, diffuse HK, severe MR, LA severely dilated, RA mod  dilated, RV mildly down   [facility] 03/05/18 Ao = 108/68 (84)  LV = 110/15 RA = 2 RV = 39/4 PA = 31/10 (20) PCW = 8 (no significant v-waves) Fick cardiac output/index = 5.0/2.7 SVR = 1307 PVR =  2.4 WU Ao sat = 97% PA sat = 68%, 70%  1. Normal coronaries 2. Severe NICM EF 20-25% 3. 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 Well compensated filling pressures  TEE 03/07/18: EF 25%, diffuse HK, RV mildly decreased function, trivial TR, severe central mitral regurgitation, ERO 0.6 cm^2 by PISA.  Impression: severe functional mitral regurgitation  SH: Smokes 2 black and milds/day. Has Medicaid, no problems with transportation. Rare ETOH use. Working now.  FH: mother with murmur, died of MI at 47 years, father died of MI in 42s.   Review of systems complete and found to be negative unless listed in HPI.   Past Medical History:  Diagnosis Date   Hyperlipidemia    Hypertension     Current Outpatient Medications  Medication Sig Dispense Refill   aspirin  81 MG chewable tablet Chew 1 tablet (81 mg total) by mouth daily. 30 tablet 5   empagliflozin  (JARDIANCE ) 10 MG TABS tablet Take 1 tablet (10 mg total) by mouth daily. 30 tablet 5   furosemide  (LASIX ) 40 MG tablet Take 1 tablet (40 mg total) by mouth daily. 30 tablet 5   rosuvastatin  (CRESTOR ) 20 MG tablet Take 1 tablet (20 mg total) by mouth daily. 30 tablet 5   sacubitril -valsartan  (ENTRESTO ) 24-26 MG  Take 1 tablet by mouth 2 (two) times daily. 60 tablet 5   spironolactone  (ALDACTONE ) 25 MG tablet Take 1 tablet (25 mg total) by mouth daily. 30 tablet 5   No current facility-administered medications for this encounter.   No Known Allergies   Social History   Socioeconomic History   Marital status: Single    Spouse name: Not on file   Number of children: Not on file   Years of education: Not on file   Highest education level: Not on file  Occupational History   Not on file  Tobacco Use   Smoking status: Every Day    Types: Cigars   Smokeless  tobacco: Never  Vaping Use   Vaping status: Never Used  Substance and Sexual Activity   Alcohol use: Yes   Drug use: Yes    Types: Marijuana   Sexual activity: Yes    Partners: Female    Birth control/protection: None  Other Topics Concern   Not on file  Social History Narrative   Not on file   Social Drivers of Health   Financial Resource Strain: Not on file  Food Insecurity: No Food Insecurity (07/26/2023)   Hunger Vital Sign    Worried About Running Out of Food in the Last Year: Never true    Ran Out of Food in the Last Year: Never true  Transportation Needs: No Transportation Needs (07/26/2023)   PRAPARE - Administrator, Civil Service (Medical): No    Lack of Transportation (Non-Medical): No  Physical Activity: Not on file  Stress: Not on file  Social Connections: Socially Integrated (07/26/2023)   Social Connection and Isolation Panel [NHANES]    Frequency of Communication with Friends and Family: More than three times a week    Frequency of Social Gatherings with Friends and Family: More than three times a week    Attends Religious Services: More than 4 times per year    Active Member of [employer] or Organizations: Patient unable to answer    Attends Banker Meetings: More than 4 times per year    Marital Status: Living with partner  Intimate Partner Violence: Not At Risk (07/26/2023)   Humiliation, Afraid, Rape, and Kick questionnaire    Fear of Current or Ex-Partner: No    Emotionally Abused: No    Physically Abused: No    Sexually Abused: No   Family History  Problem Relation Age of Onset   Heart attack Mother        Died at age of 46 from heart attack.   Heart attack Father        Died at age of 31 from heart attack.   Heart disease Brother        Heart surgery at age of 95.   Diabetes Child    BP 135/73   Pulse 64   Ht 5\' 10"  (1.778 m)   Wt 74.9 kg (165 lb 3.2 oz)   SpO2 98%   BMI 23.70 kg/m   Wt Readings from Last 3 Encounters:   08/03/23 74.9 kg (165 lb 3.2 oz)  07/27/23 74.2 kg (163 lb 9.3 oz)  11/17/21 74.8 kg (165 lb)   PHYSICAL EXAM: General:  well appearing.  No respiratory difficulty. Walked into clinic Neck: supple. JVD ~11 cm.  Cor: PMI nondisplaced. Regular rate & rhythm. No rubs, gallops or murmurs. Lungs: clear, diminished bases Extremities: no cyanosis, clubbing, rash, edema  Neuro: alert & oriented x 3. Moves all  4 extremities w/o difficulty. Affect pleasant.   ReDs reading: 37 %, abnormal  EKG NSR 69 bpm  ASSESSMENT & PLAN:  1. Chronic systolic HF due to NICM (new as of 03/2018).  - LHC normal coronaries 12/3. Etiology unclear - possibly HTN or ETOH.  - Echo 12/19: EF 30-35%, diffuse HK, severe MR, LA severely dilated, RA mod dilated, RV mildly down.  - TEE 12/19: EF 25%, severe functional MR - Echo 8/20 EF 25-30% moderate MR. - NYHA IIIb. Volume mildly elevated. Weight up 2lbs and ReDS 37%.  - Increase lasix  40>60 mg daily. BMET/BNP today. Repeat BMET in 10 days.  - Continue Entresto  24/26 mg, needs to take BID rather than daily.  - Continue spiro 25 mg daily - Continue Jardiance  10 mg daily - Will hold off on restarting beta blocker with concern he is end-stage - Worry that he is now end-stage. May need to consider advanced therapies but has not been to follow-ups d/t transportation issues. Tobacco use would prohibit transplant but may consider workup for LVAD.   2. Severe MR - TEE 03/07/18: EF 25%, diffuse HK, RV mildly decreased function, trivial TR, severe central mitral regurgitation, ERO 0.6 cm^2 by PISA.  Impression: severe functional mitral regurgitation.  - Moderate by echo in 8/20. - Functional MR.  - Mod-severe MR on echo 4/25. Functional MR with dilated annulus.   3. HTN - Elevated today. Needs to take Entresto  BID.    4. Tobacco use - Has cut back some and is only smoking 1 black and milds/day. - Discussed smoking cessation again today.  - Smoking marijuana daily.    5. ETOH use - Reports no ETOH use  6. PAD - s/p jetstream atherectomy/DCB left SFA in 2020 - Continue aspirin  and statin  Follow up in 3 weeks with APP to reassess volume. Disability paperwork provided today, will fax back once completed. Scale provided today.   Sheryl Donna, NP 08/03/23

## 2023-08-03 ENCOUNTER — Ambulatory Visit (HOSPITAL_COMMUNITY)
Admit: 2023-08-03 | Discharge: 2023-08-03 | Disposition: A | Discharge status: Home / Self Care | Source: Ambulatory Visit | Attending: Internal Medicine | Admitting: Internal Medicine

## 2023-08-03 ENCOUNTER — Encounter (HOSPITAL_COMMUNITY): Payer: Self-pay

## 2023-08-03 VITALS — BP 135/73 | HR 64 | Ht 70.0 in | Wt 165.2 lb

## 2023-08-03 DIAGNOSIS — F1290 Cannabis use, unspecified, uncomplicated: Secondary | ICD-10-CM

## 2023-08-03 DIAGNOSIS — I340 Nonrheumatic mitral (valve) insufficiency: Secondary | ICD-10-CM

## 2023-08-03 DIAGNOSIS — Z7982 Long term (current) use of aspirin: Secondary | ICD-10-CM | POA: Diagnosis not present

## 2023-08-03 DIAGNOSIS — Z720 Tobacco use: Secondary | ICD-10-CM

## 2023-08-03 DIAGNOSIS — I110 Hypertensive heart disease with heart failure: Secondary | ICD-10-CM | POA: Insufficient documentation

## 2023-08-03 DIAGNOSIS — I739 Peripheral vascular disease, unspecified: Secondary | ICD-10-CM

## 2023-08-03 DIAGNOSIS — I5022 Chronic systolic (congestive) heart failure: Secondary | ICD-10-CM | POA: Diagnosis not present

## 2023-08-03 DIAGNOSIS — I10 Essential (primary) hypertension: Secondary | ICD-10-CM | POA: Diagnosis not present

## 2023-08-03 DIAGNOSIS — F17290 Nicotine dependence, other tobacco product, uncomplicated: Secondary | ICD-10-CM | POA: Diagnosis not present

## 2023-08-03 DIAGNOSIS — R9431 Abnormal electrocardiogram [ECG] [EKG]: Secondary | ICD-10-CM | POA: Diagnosis not present

## 2023-08-03 DIAGNOSIS — I428 Other cardiomyopathies: Secondary | ICD-10-CM | POA: Diagnosis not present

## 2023-08-03 DIAGNOSIS — Z7984 Long term (current) use of oral hypoglycemic drugs: Secondary | ICD-10-CM | POA: Insufficient documentation

## 2023-08-03 DIAGNOSIS — F1010 Alcohol abuse, uncomplicated: Secondary | ICD-10-CM

## 2023-08-03 LAB — BASIC METABOLIC PANEL WITH GFR
Anion gap: 12 (ref 5–15)
BUN: 16 mg/dL (ref 6–20)
CO2: 21 mmol/L — ABNORMAL LOW (ref 22–32)
Calcium: 9.6 mg/dL (ref 8.9–10.3)
Chloride: 104 mmol/L (ref 98–111)
Creatinine, Ser: 1.29 mg/dL — ABNORMAL HIGH (ref 0.61–1.24)
GFR, Estimated: >60 mL/min (ref >60)
Glucose, Bld: 117 mg/dL — ABNORMAL HIGH (ref 70–99)
Potassium: 4.4 mmol/L (ref 3.5–5.1)
Sodium: 137 mmol/L (ref 135–145)

## 2023-08-03 LAB — BRAIN NATRIURETIC PEPTIDE: B Natriuretic Peptide: 623.2 pg/mL — ABNORMAL HIGH (ref 0.0–100.0)

## 2023-08-03 MED ORDER — FUROSEMIDE 40 MG PO TABS
60.0000 mg | ORAL_TABLET | Freq: Every day | ORAL | 3 refills | Status: DC
Start: 2023-08-03 — End: 2023-09-27

## 2023-08-03 NOTE — Progress Notes (Signed)
 ReDS Vest / Clip - 08/03/23 5784       ReDS Vest / Clip   Station Marker C    Ruler Value 31    ReDS Value Range Moderate volume overload    ReDS Actual Value 37

## 2023-08-03 NOTE — Patient Instructions (Addendum)
 Medication Changes:  TAKE ENTRESTO  24/26MG  TWICE DAILY  INCREASE LASIX  (FUROSEMIDE ) TO 60MG  ONCE DAILY   Lab Work:  Labs done today, your results will be available in MyChart, we will contact you for abnormal readings.  RETURN IN 10 DAYS AS SCHEDULED FOR REPEAT LAB WORK   SCALE GIVEN- WEIGH YOUR SELF DAILY   Follow-Up in: 3-4 WEEKS AS SCHEDULED WITH APP   At the Advanced [facility] Failure Clinic, you and your health needs are our priority. We have a designated team specialized in the treatment of Heart Failure. This Care Team includes your primary Heart Failure Specialized Cardiologist (physician), Advanced Practice Providers (APPs- Physician Assistants and Nurse Practitioners), and Pharmacist who all work together to provide you with the care you need, when you need it.   You may see any of the following providers on your designated Care Team at your next follow up:  Dr. Jules Oar Dr. Peder Bourdon Dr. Alwin Baars Dr. Judyth Nunnery Nieves Bars, NP Ruddy Corral, [place] [facility] [place], [place] Dennise Fitz, NP [place] Lee, NP Luster Salters, PharmD   Please be sure to bring in all your medications bottles to every appointment.   Need to Contact Us :  If you have any questions or concerns before your next appointment please send us  a message through [place] or call our office at [phone].    TO LEAVE A MESSAGE FOR THE NURSE SELECT OPTION 2, PLEASE LEAVE A MESSAGE INCLUDING: YOUR NAME DATE OF BIRTH CALL BACK NUMBER REASON FOR CALL**this is important as we prioritize the call backs  YOU WILL RECEIVE A CALL BACK THE SAME DAY AS LONG AS YOU CALL BEFORE 4:00 PM

## 2023-08-14 ENCOUNTER — Ambulatory Visit (HOSPITAL_COMMUNITY)
Admission: RE | Admit: 2023-08-14 | Discharge: 2023-08-14 | Disposition: A | Discharge status: Home / Self Care | Source: Ambulatory Visit | Attending: Cardiology | Admitting: Cardiology

## 2023-08-14 DIAGNOSIS — I5022 Chronic systolic (congestive) heart failure: Secondary | ICD-10-CM

## 2023-08-14 LAB — BASIC METABOLIC PANEL WITH GFR
Anion gap: 13 (ref 5–15)
BUN: 18 mg/dL (ref 6–20)
CO2: 22 mmol/L (ref 22–32)
Calcium: 9.9 mg/dL (ref 8.9–10.3)
Chloride: 105 mmol/L (ref 98–111)
Creatinine, Ser: 1.36 mg/dL — ABNORMAL HIGH (ref 0.61–1.24)
GFR, Estimated: 60 mL/min — ABNORMAL LOW (ref >60)
Glucose, Bld: 97 mg/dL (ref 70–99)
Potassium: 4.1 mmol/L (ref 3.5–5.1)
Sodium: 140 mmol/L (ref 135–145)

## 2023-08-15 ENCOUNTER — Ambulatory Visit (HOSPITAL_COMMUNITY): Payer: Self-pay | Admitting: Internal Medicine

## 2023-08-31 ENCOUNTER — Encounter (HOSPITAL_COMMUNITY): Payer: Self-pay

## 2023-08-31 ENCOUNTER — Ambulatory Visit (HOSPITAL_COMMUNITY)
Admission: RE | Admit: 2023-08-31 | Discharge: 2023-08-31 | Disposition: A | Discharge status: Home / Self Care | Source: Ambulatory Visit | Attending: Cardiology | Admitting: Cardiology

## 2023-08-31 ENCOUNTER — Ambulatory Visit (HOSPITAL_COMMUNITY): Payer: Self-pay | Admitting: Cardiology

## 2023-08-31 VITALS — BP 128/80 | HR 72 | Wt 166.2 lb

## 2023-08-31 DIAGNOSIS — F1090 Alcohol use, unspecified, uncomplicated: Secondary | ICD-10-CM | POA: Insufficient documentation

## 2023-08-31 DIAGNOSIS — Z79899 Other long term (current) drug therapy: Secondary | ICD-10-CM | POA: Insufficient documentation

## 2023-08-31 DIAGNOSIS — I340 Nonrheumatic mitral (valve) insufficiency: Secondary | ICD-10-CM | POA: Diagnosis present

## 2023-08-31 DIAGNOSIS — I739 Peripheral vascular disease, unspecified: Secondary | ICD-10-CM

## 2023-08-31 DIAGNOSIS — F17290 Nicotine dependence, other tobacco product, uncomplicated: Secondary | ICD-10-CM | POA: Diagnosis not present

## 2023-08-31 DIAGNOSIS — I110 Hypertensive heart disease with heart failure: Secondary | ICD-10-CM | POA: Insufficient documentation

## 2023-08-31 DIAGNOSIS — I5022 Chronic systolic (congestive) heart failure: Secondary | ICD-10-CM | POA: Diagnosis present

## 2023-08-31 DIAGNOSIS — I428 Other cardiomyopathies: Secondary | ICD-10-CM | POA: Insufficient documentation

## 2023-08-31 DIAGNOSIS — Z7984 Long term (current) use of oral hypoglycemic drugs: Secondary | ICD-10-CM | POA: Insufficient documentation

## 2023-08-31 DIAGNOSIS — F1290 Cannabis use, unspecified, uncomplicated: Secondary | ICD-10-CM | POA: Diagnosis not present

## 2023-08-31 LAB — BASIC METABOLIC PANEL WITH GFR
Anion gap: 11 (ref 5–15)
BUN: 15 mg/dL (ref 6–20)
CO2: 23 mmol/L (ref 22–32)
Calcium: 9.6 mg/dL (ref 8.9–10.3)
Chloride: 107 mmol/L (ref 98–111)
Creatinine, Ser: 1.18 mg/dL (ref 0.61–1.24)
GFR, Estimated: >60 mL/min (ref >60)
Glucose, Bld: 106 mg/dL — ABNORMAL HIGH (ref 70–99)
Potassium: 3.9 mmol/L (ref 3.5–5.1)
Sodium: 141 mmol/L (ref 135–145)

## 2023-08-31 LAB — BRAIN NATRIURETIC PEPTIDE: B Natriuretic Peptide: 327.8 pg/mL — ABNORMAL HIGH (ref 0.0–100.0)

## 2023-08-31 MED ORDER — ENTRESTO 49-51 MG PO TABS: 1.0000 | ORAL_TABLET | Freq: Two times a day (BID) | ORAL | 11 refills | Status: DC

## 2023-08-31 NOTE — Progress Notes (Signed)
 Advanced Heart Failure Clinic Note   PCP: Patient, No Pcp Per PCP-Cardiologist: Lauro Portal, MD  HF: Dr. Julane Ny  HPI: Martin Keller is a 58 y.o. male with a hx of HTN, tobacco use, systolic HF (diagnosed 03/2018), and severe MR (diagnosed 12/19).    He was admitted 12/19 with acute systolic HF. Echo showed EF 30-35% and severe MR. R/LHC showed normal coronaries EF 20-25% and severe 3-4+ MR. TEE EF 25% severe functional MR.   Follow up with PharmD 12/21 and he was still only taking Entresto  and carvedilol  once a day. He was given a bill box and started on Jardiance  10 mg daily.  Echo 11/22: EF 25-30%, LVIDd 6.7 cm, RV okay, severe MR  Admitted to [facility] 4/25 with worsening dyspnea, orthopnea, decreased appetite and BLE edema. Transferred to [facility]. Had been out of his medications for several weeks. Echo with EF 25%, GIIIDD, RV mildly reduced, LA severely dilated, RA mildly dilated, Mod-severe MR. Functional MR with dilated annulus. Trivial TR. He diuresed very well with IV lasix . GDMT was able to be restarted. Transitioned to PO Lasix .   He presents today for f/u and further med titration. Reports feeling better. Has been compliant w/ medications. Breathing improved, now NYHA Class II, and more limited by b/l leg claudication than dyspnea. Has PAD s/p LE intervention in 2020. Has not seen [facility] since 2021. Still smoking.   BP well controlled, 128/80. Denies side effects w/ meds. No orthostatic symptoms.     Cardiac Studies Reviewed:  Echo 07/27/23: EF 25%, GIIIDD, RV mildly reduced, LA severely dilated, RA mildly dilated, Mod-severe MR. Functional MR with dilated annulus. Trivial TR. Echo 11/22: EF 25-30%, LVIDd 6.7 cm, RV okay, severe MR Echo 11/2018: EF ~30%  Echo 03/04/18: EF 30-35%, diffuse HK, severe MR, LA severely dilated, RA mod dilated, RV mildly down   [facility] 03/05/18 Ao = 108/68 (84)  LV = 110/15 RA = 2 RV = 39/4 PA = 31/10 (20) PCW = 8 (no significant  v-waves) Fick cardiac output/index = 5.0/2.7 SVR = 1307 PVR =  2.4 WU Ao sat = 97% PA sat = 68%, 70%  1. Normal coronaries 2. Severe NICM EF 20-25% 3. 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 Well compensated filling pressures  TEE 03/07/18: EF 25%, diffuse HK, RV mildly decreased function, trivial TR, severe central mitral regurgitation, ERO 0.6 cm^2 by PISA.  Impression: severe functional mitral regurgitation  SH: Smokes 2 black and milds/day. Has Medicaid, no problems with transportation. Rare ETOH use. Working now.  FH: mother with murmur, died of MI at 54 years, father died of MI in 64s.   Review of systems complete and found to be negative unless listed in HPI.   Past Medical History:  Diagnosis Date   Hyperlipidemia    Hypertension     Current Outpatient Medications  Medication Sig Dispense Refill   aspirin  81 MG chewable tablet Chew 1 tablet (81 mg total) by mouth daily. 30 tablet 5   empagliflozin  (JARDIANCE ) 10 MG TABS tablet Take 1 tablet (10 mg total) by mouth daily. 30 tablet 5   furosemide  (LASIX ) 40 MG tablet Take 1.5 tablets (60 mg total) by mouth daily. 45 tablet 3   rosuvastatin  (CRESTOR ) 20 MG tablet Take 1 tablet (20 mg total) by mouth daily. 30 tablet 5   sacubitril -valsartan  (ENTRESTO ) 24-26 MG Take 1 tablet by mouth 2 (two) times daily. 60 tablet 5   spironolactone  (ALDACTONE ) 25 MG tablet Take 1 tablet (25 mg total)  by mouth daily. 30 tablet 5   No current facility-administered medications for this encounter.   No Known Allergies   Social History   Socioeconomic History   Marital status: Single    Spouse name: Not on file   Number of children: Not on file   Years of education: Not on file   Highest education level: Not on file  Occupational History   Not on file  Tobacco Use   Smoking status: Every Day    Types: Cigars   Smokeless tobacco: Never  Vaping Use   Vaping status: Never Used  Substance and Sexual Activity   Alcohol use: Yes   Drug use: Yes    Types:  Marijuana   Sexual activity: Yes    Partners: Female    Birth control/protection: None  Other Topics Concern   Not on file  Social History Narrative   Not on file   Social Drivers of Health   Financial Resource Strain: Not on file  Food Insecurity: No Food Insecurity (07/26/2023)   Hunger Vital Sign    Worried About Running Out of Food in the Last Year: Never true    Ran Out of Food in the Last Year: Never true  Transportation Needs: No Transportation Needs (07/26/2023)   PRAPARE - Administrator, Civil Service (Medical): No    Lack of Transportation (Non-Medical): No  Physical Activity: Not on file  Stress: Not on file  Social Connections: Socially Integrated (07/26/2023)   Social Connection and Isolation Panel [NHANES]    Frequency of Communication with Friends and Family: More than three times a week    Frequency of Social Gatherings with Friends and Family: More than three times a week    Attends Religious Services: More than 4 times per year    Active Member of [employer] or Organizations: Patient unable to answer    Attends Banker Meetings: More than 4 times per year    Marital Status: Living with partner  Intimate Partner Violence: Not At Risk (07/26/2023)   Humiliation, Afraid, Rape, and Kick questionnaire    Fear of Current or Ex-Partner: No    Emotionally Abused: No    Physically Abused: No    Sexually Abused: No   Family History  Problem Relation Age of Onset   Heart attack Mother        Died at age of 49 from heart attack.   Heart attack Father        Died at age of 90 from heart attack.   Heart disease Brother        Heart surgery at age of 83.   Diabetes Child    BP 128/80   Pulse 72   Wt 75.4 kg (166 lb 3.2 oz)   SpO2 97%   BMI 23.85 kg/m   Wt Readings from Last 3 Encounters:  08/31/23 75.4 kg (166 lb 3.2 oz)  08/03/23 74.9 kg (165 lb 3.2 oz)  07/27/23 74.2 kg (163 lb 9.3 oz)   PHYSICAL EXAM: General:  Well appearing. No  respiratory difficulty HEENT: dentition in poor repair, otherwise normal Neck: supple. JVD not elevated. Carotids 2+ bilat; no bruits. No lymphadenopathy or thyromegaly appreciated. Cor: PMI nondisplaced. Regular rate & rhythm. 3/6 MR murmur  Abdomen: soft, nontender, nondistended. No hepatosplenomegaly. No bruits or masses. Good bowel sounds. Extremities: no cyanosis, clubbing, rash, edema Neuro: alert & oriented x 3, cranial nerves grossly intact. moves all 4 extremities w/o difficulty. Affect pleasant.  EKG: not performed   ASSESSMENT & PLAN:  1. Chronic systolic HF due to NICM (new as of 03/2018).  - LHC normal coronaries 12/3. Etiology unclear - possibly HTN or ETOH.  - Echo 12/19: EF 30-35%, diffuse HK, severe MR, LA severely dilated, RA mod dilated, RV mildly down.  - TEE 12/19: EF 25%, severe functional MR - Echo 8/20 EF 25-30% moderate MR. - Echo 4/25 EF 25%, RV mildly reduced, mod-severe MR - NYHA Class II. Grossly euvolemic on exam  - Check BMP/BNP today  - Continue Lasix  60 mg daily   - Increase Entresto  to 49-51 mg bid  - Continue spiro 25 mg daily - Continue Jardiance  10 mg daily - Will hold off on restarting beta blocker with concern he is end-stage - Worry that he is now end-stage. May need to consider future advanced therapies. Tobacco use would prohibit transplant but may consider workup for LVAD. Needs to continue to show good compliance w/ routine f/us  and w/ meds.  - f/u w/ PharmD in 4 wks for further med titration - repeat echo in 3 months    2. Severe MR - TEE 03/07/18: EF 25%, diffuse HK, RV mildly decreased function, trivial TR, severe central mitral regurgitation, ERO 0.6 cm^2 by PISA.  Impression: severe functional mitral regurgitation.  - Moderate by echo in 8/20. - Mod-severe MR on echo 4/25. Functional MR with dilated annulus.  - continue HF optimization per above   3. HTN  -Controlled on current regimen - GDMT per above. Increasing Entresto      4. Tobacco use - Has cut back some but still smoking 1 black and milds/day. - Smoking marijuana daily. - smoking cessation advised    5. ETOH use - denies habitual use, only on social occasions   6. PAD - s/p jetstream atherectomy/DCB left SFA in 2020 - reports b/l claudication, still smoking. Has not seen [facility] since 2021 - refer back to [facility] (Dr. Vikki Graves)  - Continue aspirin  and statin  F/u w/ pharmD in 4 wks. F/u w/ APP or Dr. Julane Ny in 3 months w/ repeat echo   Ruddy Corral, PA-C 08/31/23

## 2023-08-31 NOTE — Patient Instructions (Addendum)
 INCREASE Entresto  to 49/51 mg Twice daily  Labs done today, your results will be available in MyChart, we will contact you for abnormal readings.  Your physician has requested that you have an echocardiogram. Echocardiography is a painless test that uses sound waves to create images of your heart. It provides your doctor with information about the size and shape of your heart and how well your heart's chambers and valves are working. This procedure takes approximately one hour. There are no restrictions for this procedure. Please do NOT wear cologne, perfume, aftershave, or lotions (deodorant is allowed). Please arrive 15 minutes prior to your appointment time.  Please note: We ask at that you not bring children with you during ultrasound (echo/ vascular) testing. Due to room size and safety concerns, children are not allowed in the ultrasound rooms during exams. Our front office staff cannot provide observation of children in our lobby area while testing is being conducted. An adult accompanying a patient to their appointment will only be allowed in the ultrasound room at the discretion of the ultrasound technician under special circumstances. We apologize for any inconvenience.  You have been referred to the Vascular Doctors. They will call you to arrange your appointment.  Please follow up with our heart failure pharmacist in 4 weeks  Your physician recommends that you schedule a follow-up appointment in: 3 months   If you have any questions or concerns before your next appointment please send us  a message through [place] or call our office at [phone].    TO LEAVE A MESSAGE FOR THE NURSE SELECT OPTION 2, PLEASE LEAVE A MESSAGE INCLUDING: YOUR NAME DATE OF BIRTH CALL BACK NUMBER REASON FOR CALL**this is important as we prioritize the call backs  YOU WILL RECEIVE A CALL BACK THE SAME DAY AS LONG AS YOU CALL BEFORE 4:00 PM  At the [facility], you and your health  needs are our priority. As part of our continuing mission to provide you with exceptional heart care, we have created designated Provider Care Teams. These Care Teams include your primary Cardiologist (physician) and Advanced Practice Providers (APPs- Physician Assistants and Nurse Practitioners) who all work together to provide you with the care you need, when you need it.   You may see any of the following providers on your designated Care Team at your next follow up: Dr Jules Oar Dr Peder Bourdon Dr. Alwin Baars Dr. Arta Lark Amy Marijane Shoulders, NP Ruddy Corral, [place] [facility] [place], [place] Dennise Fitz, NP [place] Lee, NP [place] Dellen, NP Luster Salters, PharmD Bevely Brush, PharmD   Please be sure to bring in all your medications bottles to every appointment.    Thank you for choosing [facility]

## 2023-09-19 NOTE — Progress Notes (Signed)
 Advanced Heart Failure Clinic Note     PCP: Patient, No Pcp Per PCP-Cardiologist: Dorn Lesches, MD  HF: Dr. Cherrie  HPI:  Martin Keller is a 58 y.o. male with a hx of HTN, tobacco use, systolic HF (diagnosed 03/2018), and severe MR (diagnosed 03/2018).    He was admitted 03/2018 with acute systolic HF. Echo showed EF 30-35% and severe MR. R/LHC showed normal coronaries EF 20-25% and severe 3-4+ MR. TEE EF 25% severe functional MR.    Follow up with PharmD 03/2020 and he was still only taking Entresto  and carvedilol  once a day. He was given a pill box and started on Jardiance  10 mg daily.   Echo 02/2021: EF 25-30%, LVIDd 6.7 cm, RV okay, severe MR   Admitted to [facility] 07/2023 with worsening dyspnea, orthopnea, decreased appetite and BLE edema. Transferred to [facility]. Had been out of his medications for several weeks. Echo with EF 25%, GIIIDD, RV mildly reduced, LA severely dilated, RA mildly dilated, Mod-severe MR. Functional MR with dilated annulus. Trivial TR. He diuresed very well with IV Lasix . GDMT was able to be restarted. Transitioned to PO Lasix .    He presented to AHF Clinic for f/u and further med titration 08/31/2023. Reported feeling better. Had been compliant w/ medications. Breathing had improved, now NYHA Class II, and more limited by b/l leg claudication than dyspnea. Has PAD s/p LE intervention in 2020. Had not seen [facility] since 2021. Still smoking. BP was well controlled, 128/80. Denied side effects w/ meds. No orthostatic symptoms.   Today he returns to HF clinic for pharmacist medication titration. At last visit with APP, Entresto  was increased to 49/51 mg BID. However, patient reports that he just started taking the increased dose last Friday (3 days) ago. He endorses taking all other medications except aspirin  since he said he was not given that. Of note, he is taking Lasix  40 mg daily (not 60 mg daily as specified on medication list) and dispense history indicates  Jardiance , spironolactone  and Lasix  have not been filled since 07/27/23. Although dispense history in epic is not always accurate, it did confirm Entresto  was last filled on 09/26/23. Overall he is feeling well today. No dizziness, lightheadedness, CP or palpitations. Breathing has improved, can walk 1 block before becoming SOB. Weight at home has been stable at 166 lbs. Says he has been taking Lasix  60 mg daily, but then states he only takes 1 tablet daily, which would indicate he is taking Lasix  40 mg daily. No LEE, PND or orthopnea. Takes most of his medications at 6-7PM after he gets off work. Says that for his BID medications, he takes the next dose at 12PM.    HF Medications: Entresto  49/51 mg BID Spironolactone  25 mg daily Jardiance  10 mg daily Lasix  60 mg daily - taking 40 mg daily  Has the patient been experiencing any side effects to the medications prescribed?  no  Does the patient have any problems obtaining medications due to transportation or finances?   No, [place] Medicaid  Understanding of regimen: fair Understanding of indications: fair Potential of compliance: fair Patient understands to avoid NSAIDs. Patient understands to avoid decongestants.    Pertinent Lab Values: 08/31/23: Serum creatinine 1.18, BUN 15, Potassium 3.9, Sodium 141 BMET today pending  Vital Signs: Weight: 167.6 lbs (last clinic weight: 166.2 lbs) Blood pressure: 118/68  Heart rate: 62   Assessment/Plan: 1. Chronic systolic HF due to NICM (new as of 03/2018).  - LHC normal coronaries  03/05/18. Etiology unclear - possibly HTN or ETOH.  - Echo 03/2018: EF 30-35%, diffuse HK, severe MR, LA severely dilated, RA mod dilated, RV mildly down.  - TEE 03/2018: EF 25%, severe functional MR - Echo 11/2018 EF 25-30% moderate MR. - Echo 07/2023 EF 25%, RV mildly reduced, mod-severe MR - NYHA Class II. Euvolemic on exam  - Check BMET today  - Continue Lasix  40 mg daily. Updated medication list to reflect how he  reports taking it.   - Continue Entresto  49-51 mg BID. Will not increase today as he just increased his dose 3 days ago. I asked him to take his Entresto  at 6AM and 6PM rather than 6PM and 12PM to prevent dose stacking. He feels like he can start taking his morning dose before work.  - Continue spironolactone  25 mg daily - Continue Jardiance  10 mg daily -Last AHF Clinic note documented that there was a concern he is now end-stage. May need to consider future advanced therapies. Tobacco use would prohibit transplant but may consider workup for LVAD. Needs to continue to show good compliance w/ routine f/us  and w/ meds.  - Repeat echo in 3 months    2. Severe MR - TEE 03/07/18: EF 25%, diffuse HK, RV mildly decreased function, trivial TR, severe central mitral regurgitation, ERO 0.6 cm^2 by PISA.  Impression: severe functional mitral regurgitation.  - Moderate by echo in 11/2018. - Mod-severe MR on echo 07/2023. Functional MR with dilated annulus.  - Continue HF optimization per above    3. HTN  -Controlled on current regimen - GDMT per above.    4. Tobacco use - Has cut back some but still smoking 1 black and milds/day. - Smoking marijuana daily. - smoking cessation advised     5. ETOH use - denies habitual use, only on social occasions    6. PAD - s/p jetstream atherectomy/DCB left SFA in 2020 - reports b/l claudication, still smoking. Has not seen [facility] since 2021 - referred back to [facility] (Dr. Sheree) at last visit - Continue statin. Restart aspirin . Informed patient he could obtain aspirin  81 mg at any pharmacy OTC.    Follow up 2 months with [facility] with echo.   Tinnie Redman, PharmD, BCPS, BCCP, CPP Heart Failure Clinic Pharmacist [phone]

## 2023-09-25 ENCOUNTER — Encounter (HOSPITAL_COMMUNITY): Payer: Self-pay | Admitting: *Deleted

## 2023-09-25 NOTE — Progress Notes (Signed)
 Disability form completed with est RTW date of ~3 months (Sept), signed by Dr Cherrie, and faxed into company at [phone], pt aware this was done

## 2023-09-26 ENCOUNTER — Telehealth: Payer: Self-pay | Admitting: Home Health

## 2023-09-26 NOTE — Telephone Encounter (Signed)
 Patient called after-hours line today, states he had ran out of all his medication and need refill.  He does not know what medication he is on or what dosing, states there are 6 bottles.  Patient was recently seen on 08/31/2023.  Advised patient to call advanced heart failure clinic tomorrow morning, review all his medication and dosing before refill can be properly sent.  Will forward this message to [place] PA-C who saw him recently.

## 2023-09-27 MED ORDER — ENTRESTO 49-51 MG PO TABS: 1.0000 | ORAL_TABLET | Freq: Two times a day (BID) | ORAL | 11 refills | Status: AC

## 2023-09-27 MED ORDER — SPIRONOLACTONE 25 MG PO TABS: 25.0000 mg | ORAL_TABLET | Freq: Every day | ORAL | 5 refills | Status: DC

## 2023-09-27 MED ORDER — FUROSEMIDE 40 MG PO TABS
60.0000 mg | ORAL_TABLET | Freq: Every day | ORAL | 3 refills | Status: DC
Start: 2023-09-27 — End: 2023-10-01

## 2023-09-27 MED ORDER — ROSUVASTATIN CALCIUM 20 MG PO TABS
20.0000 mg | ORAL_TABLET | Freq: Every day | ORAL | 5 refills | Status: DC
Start: 2023-09-27 — End: 2023-10-01

## 2023-09-27 MED ORDER — EMPAGLIFLOZIN 10 MG PO TABS: 10.0000 mg | ORAL_TABLET | Freq: Every day | ORAL | 5 refills | Status: DC

## 2023-09-27 NOTE — Telephone Encounter (Signed)
 REFILLS SENT ATTEMPTED TO CONTACT PT NO ANSWER-NO VOICEMAIL

## 2023-09-27 NOTE — Addendum Note (Signed)
 Addended by: JERONA DALTON HERO on: 09/27/2023 03:16 PM   Modules accepted: Orders

## 2023-09-28 NOTE — Telephone Encounter (Signed)
 Spoke with patient and made him aware that all prescriptions have been sent to pharmacy and from our end these have been received at [facility] [place] [address]. Patient aware and verbalized understanding.   Advised patient to call back to office with any issues, questions, or concerns. Patient verbalized understanding.

## 2023-09-28 NOTE — Telephone Encounter (Signed)
 Attempted to call patient, unable to leave message for patient to call back to office.

## 2023-10-01 ENCOUNTER — Ambulatory Visit (HOSPITAL_COMMUNITY)
Admission: RE | Admit: 2023-10-01 | Discharge: 2023-10-01 | Disposition: A | Discharge status: Home / Self Care | Source: Ambulatory Visit | Attending: Cardiology | Admitting: Cardiology

## 2023-10-01 VITALS — BP 118/68 | HR 62 | Wt 167.6 lb

## 2023-10-01 DIAGNOSIS — I5022 Chronic systolic (congestive) heart failure: Secondary | ICD-10-CM | POA: Insufficient documentation

## 2023-10-01 LAB — BASIC METABOLIC PANEL WITH GFR
Anion gap: 11 (ref 5–15)
BUN: 12 mg/dL (ref 6–20)
CO2: 23 mmol/L (ref 22–32)
Calcium: 9.4 mg/dL (ref 8.9–10.3)
Chloride: 106 mmol/L (ref 98–111)
Creatinine, Ser: 1.25 mg/dL — ABNORMAL HIGH (ref 0.61–1.24)
GFR, Estimated: >60 mL/min (ref >60)
Glucose, Bld: 77 mg/dL (ref 70–99)
Potassium: 3.7 mmol/L (ref 3.5–5.1)
Sodium: 140 mmol/L (ref 135–145)

## 2023-10-01 MED ORDER — EMPAGLIFLOZIN 10 MG PO TABS: 10.0000 mg | ORAL_TABLET | Freq: Every day | ORAL | 3 refills | Status: AC

## 2023-10-01 MED ORDER — FUROSEMIDE 40 MG PO TABS
40.0000 mg | ORAL_TABLET | Freq: Every day | ORAL | 3 refills | Status: DC
Start: 2023-10-01 — End: 2024-01-04

## 2023-10-01 MED ORDER — ASPIRIN 81 MG PO CHEW: 81.0000 mg | CHEWABLE_TABLET | Freq: Every day | ORAL | 3 refills | Status: DC

## 2023-10-01 MED ORDER — SPIRONOLACTONE 25 MG PO TABS: 25.0000 mg | ORAL_TABLET | Freq: Every day | ORAL | 3 refills | Status: AC

## 2023-10-01 MED ORDER — ROSUVASTATIN CALCIUM 20 MG PO TABS: 20.0000 mg | ORAL_TABLET | Freq: Every day | ORAL | 3 refills | Status: AC

## 2023-10-01 NOTE — Patient Instructions (Signed)
It was a pleasure seeing you today!  MEDICATIONS: -No medication changes today -Call if you have questions about your medications.  LABS: -We will call you if your labs need attention.  NEXT APPOINTMENT: Return to clinic in 2 months with [facility].  In general, to take care of your heart failure: -Limit your fluid intake to 2 Liters (half-gallon) per day.   -Limit your salt intake to ideally 2-3 grams (2000-3000 mg) per day. -Weigh yourself daily and record, and bring that "weight diary" to your next appointment.  (Weight gain of 2-3 pounds in 1 day typically means fluid weight.) -The medications for your heart are to help your heart and help you live longer.   -Please contact us before stopping any of your heart medications.  Call the clinic at [phone] with questions or to reschedule future appointments.

## 2023-11-28 NOTE — Progress Notes (Incomplete)
 Advanced Heart Failure Clinic Note   PCP: Patient, No Pcp Per PCP-Cardiologist: Dorn Lesches, MD  HF: Dr. Cherrie  HPI: Martin Keller is a 57 y.o. male with a hx of HTN, tobacco use, systolic HF (diagnosed 03/2018), and severe MR (diagnosed 12/19).    He was admitted 12/19 with acute systolic HF. Echo showed EF 30-35% and severe MR. R/LHC showed normal coronaries EF 20-25% and severe 3-4+ MR. TEE EF 25% severe functional MR.   Follow up with PharmD 12/21 and he was still only taking Entresto  and carvedilol  once a day. He was given a bill box and started on Jardiance  10 mg daily.  Echo 11/22: EF 25-30%, LVIDd 6.7 cm, RV okay, severe MR  Admitted to [facility] 4/25 with worsening dyspnea, orthopnea, decreased appetite and BLE edema. Transferred to [facility]. Had been out of his medications for several weeks. Echo with EF 25%, GIIIDD, RV mildly reduced, LA severely dilated, RA mildly dilated, Mod-severe MR. Functional MR with dilated annulus. Trivial TR. He diuresed very well with IV lasix . GDMT was able to be restarted. Transitioned to PO Lasix .   He presents today for f/u and further med titration. Reports feeling better. Has been compliant w/ medications. Breathing improved, now NYHA Class II, and more limited by b/l leg claudication than dyspnea. Has PAD s/p LE intervention in 2020. Has not seen [facility] since 2021. Still smoking.   BP well controlled, 128/80. Denies side effects w/ meds. No orthostatic symptoms.     Cardiac Studies Reviewed:  Echo 07/27/23: EF 25%, GIIIDD, RV mildly reduced, LA severely dilated, RA mildly dilated, Mod-severe MR. Functional MR with dilated annulus. Trivial TR. Echo 11/22: EF 25-30%, LVIDd 6.7 cm, RV okay, severe MR Echo 11/2018: EF ~30%  Echo 03/04/18: EF 30-35%, diffuse HK, severe MR, LA severely dilated, RA mod dilated, RV mildly down   [facility] 03/05/18 Ao = 108/68 (84)  LV = 110/15 RA = 2 RV = 39/4 PA = 31/10 (20) PCW = 8 (no significant  v-waves) Fick cardiac output/index = 5.0/2.7 SVR = 1307 PVR =  2.4 WU Ao sat = 97% PA sat = 68%, 70%  1. Normal coronaries 2. Severe NICM EF 20-25% 3. 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 Well compensated filling pressures  TEE 03/07/18: EF 25%, diffuse HK, RV mildly decreased function, trivial TR, severe central mitral regurgitation, ERO 0.6 cm^2 by PISA.  Impression: severe functional mitral regurgitation  SH: Smokes 2 black and milds/day. Has Medicaid, no problems with transportation. Rare ETOH use. Working now.  FH: mother with murmur, died of MI at 80 years, father died of MI in 59s.   Review of systems complete and found to be negative unless listed in HPI.   Past Medical History:  Diagnosis Date   Hyperlipidemia    Hypertension     Current Outpatient Medications  Medication Sig Dispense Refill   aspirin  81 MG chewable tablet Chew 1 tablet (81 mg total) by mouth daily. 90 tablet 3   empagliflozin  (JARDIANCE ) 10 MG TABS tablet Take 1 tablet (10 mg total) by mouth daily. 90 tablet 3   furosemide  (LASIX ) 40 MG tablet Take 1 tablet (40 mg total) by mouth daily. 90 tablet 3   rosuvastatin  (CRESTOR ) 20 MG tablet Take 1 tablet (20 mg total) by mouth daily. 90 tablet 3   sacubitril -valsartan  (ENTRESTO ) 49-51 MG Take 1 tablet by mouth 2 (two) times daily. 60 tablet 11   spironolactone  (ALDACTONE ) 25 MG tablet Take 1 tablet (25 mg total)  by mouth daily. 90 tablet 3   No current facility-administered medications for this visit.   No Known Allergies   Social History   Socioeconomic History   Marital status: Single    Spouse name: Not on file   Number of children: Not on file   Years of education: Not on file   Highest education level: Not on file  Occupational History   Not on file  Tobacco Use   Smoking status: Every Day    Types: Cigars   Smokeless tobacco: Never  Vaping Use   Vaping status: Never Used  Substance and Sexual Activity   Alcohol use: Yes   Drug use: Yes    Types:  Marijuana   Sexual activity: Yes    Partners: Female    Birth control/protection: None  Other Topics Concern   Not on file  Social History Narrative   Not on file   Social Drivers of Health   Financial Resource Strain: Not on file  Food Insecurity: No Food Insecurity (07/26/2023)   Hunger Vital Sign    Worried About Running Out of Food in the Last Year: Never true    Ran Out of Food in the Last Year: Never true  Transportation Needs: No Transportation Needs (07/26/2023)   PRAPARE - Administrator, Civil Service (Medical): No    Lack of Transportation (Non-Medical): No  Physical Activity: Not on file  Stress: Not on file  Social Connections: Socially Integrated (07/26/2023)   Social Connection and Isolation Panel    Frequency of Communication with Friends and Family: More than three times a week    Frequency of Social Gatherings with Friends and Family: More than three times a week    Attends Religious Services: More than 4 times per year    Active Member of [employer] or Organizations: Patient unable to answer    Attends Banker Meetings: More than 4 times per year    Marital Status: Living with partner  Intimate Partner Violence: Not At Risk (07/26/2023)   Humiliation, Afraid, Rape, and Kick questionnaire    Fear of Current or Ex-Partner: No    Emotionally Abused: No    Physically Abused: No    Sexually Abused: No   Family History  Problem Relation Age of Onset   Heart attack Mother        Died at age of 24 from heart attack.   Heart attack Father        Died at age of 19 from heart attack.   Heart disease Brother        Heart surgery at age of 42.   Diabetes Child    There were no vitals taken for this visit.  Wt Readings from Last 3 Encounters:  10/01/23 76 kg (167 lb 9.6 oz)  08/31/23 75.4 kg (166 lb 3.2 oz)  08/03/23 74.9 kg (165 lb 3.2 oz)   PHYSICAL EXAM: General:  Well appearing. No respiratory difficulty HEENT: dentition in poor repair,  otherwise normal Neck: supple. JVD not elevated. Carotids 2+ bilat; no bruits. No lymphadenopathy or thyromegaly appreciated. Cor: PMI nondisplaced. Regular rate & rhythm. 3/6 MR murmur  Abdomen: soft, nontender, nondistended. No hepatosplenomegaly. No bruits or masses. Good bowel sounds. Extremities: no cyanosis, clubbing, rash, edema Neuro: alert & oriented x 3, cranial nerves grossly intact. moves all 4 extremities w/o difficulty. Affect pleasant.    EKG: not performed   ASSESSMENT & PLAN:  1. Chronic systolic HF due to NICM (  new as of 03/2018).  - LHC normal coronaries 12/3. Etiology unclear - possibly HTN or ETOH.  - Echo 12/19: EF 30-35%, diffuse HK, severe MR, LA severely dilated, RA mod dilated, RV mildly down.  - TEE 12/19: EF 25%, severe functional MR - Echo 8/20 EF 25-30% moderate MR. - Echo 4/25 EF 25%, RV mildly reduced, mod-severe MR - NYHA Class II. Grossly euvolemic on exam  - Check BMP/BNP today  - Continue Lasix  60 mg daily   - Increase Entresto  to 49-51 mg bid  - Continue spiro 25 mg daily - Continue Jardiance  10 mg daily - Will hold off on restarting beta blocker with concern he is end-stage - Worry that he is now end-stage. May need to consider future advanced therapies. Tobacco use would prohibit transplant but may consider workup for LVAD. Needs to continue to show good compliance w/ routine f/us  and w/ meds.  - f/u w/ PharmD in 4 wks for further med titration - repeat echo in 3 months    2. Severe MR - TEE 03/07/18: EF 25%, diffuse HK, RV mildly decreased function, trivial TR, severe central mitral regurgitation, ERO 0.6 cm^2 by PISA.  Impression: severe functional mitral regurgitation.  - Moderate by echo in 8/20. - Mod-severe MR on echo 4/25. Functional MR with dilated annulus.  - continue HF optimization per above   3. HTN  -Controlled on current regimen - GDMT per above. Increasing Entresto     4. Tobacco use - Has cut back some but still smoking 1  black and milds/day. - Smoking marijuana daily. - smoking cessation advised    5. ETOH use - denies habitual use, only on social occasions   6. PAD - s/p jetstream atherectomy/DCB left SFA in 2020 - reports b/l claudication, still smoking. Has not seen [facility] since 2021 - refer back to [facility] (Dr. Sheree)  - Continue aspirin  and statin  F/u w/ pharmD in 4 wks. F/u w/ [facility] or Dr. Cherrie in 3 months w/ repeat echo   Harlene CHRISTELLA Gainer, [place] 11/28/23

## 2023-11-29 ENCOUNTER — Telehealth (HOSPITAL_COMMUNITY): Payer: Self-pay

## 2023-11-29 NOTE — Telephone Encounter (Signed)
 Called to confirm/remind patient of their appointment at the [facility] Failure Clinic on 11/30/23.   Appointment:   [x] Confirmed  [] Left mess   [] No answer/No voice mail  [] VM Full/unable to leave message  [] Phone not in service  Patient reminded to bring all medications and/or complete list.  Confirmed patient has transportation. Gave directions, instructed to utilize valet parking.

## 2023-11-30 ENCOUNTER — Encounter (HOSPITAL_COMMUNITY)

## 2023-11-30 ENCOUNTER — Ambulatory Visit (HOSPITAL_COMMUNITY): Attending: Internal Medicine

## 2024-01-03 ENCOUNTER — Telehealth (HOSPITAL_COMMUNITY): Payer: Self-pay

## 2024-01-03 NOTE — Telephone Encounter (Signed)
 Called to confirm/remind patient of their appointment at the Advanced [facility] Failure Clinic on 01/04/24.   Appointment:   [] Confirmed  [] Left mess   [x] No answer/No voice mail  [] VM Full/unable to leave message  [] Phone not in service

## 2024-01-04 ENCOUNTER — Encounter (HOSPITAL_COMMUNITY): Payer: Self-pay

## 2024-01-04 ENCOUNTER — Ambulatory Visit (HOSPITAL_COMMUNITY)
Admission: RE | Admit: 2024-01-04 | Discharge: 2024-01-04 | Disposition: A | Discharge status: Home / Self Care | Source: Ambulatory Visit | Attending: Cardiology | Admitting: Cardiology

## 2024-01-04 ENCOUNTER — Ambulatory Visit (HOSPITAL_BASED_OUTPATIENT_CLINIC_OR_DEPARTMENT_OTHER)
Admission: RE | Admit: 2024-01-04 | Discharge: 2024-01-04 | Disposition: A | Discharge status: Home / Self Care | Source: Ambulatory Visit | Attending: Internal Medicine | Admitting: Internal Medicine

## 2024-01-04 ENCOUNTER — Ambulatory Visit (HOSPITAL_COMMUNITY): Payer: Self-pay | Admitting: Family Medicine

## 2024-01-04 VITALS — BP 142/82 | HR 61 | Wt 171.6 lb

## 2024-01-04 DIAGNOSIS — Z7982 Long term (current) use of aspirin: Secondary | ICD-10-CM | POA: Diagnosis not present

## 2024-01-04 DIAGNOSIS — I5022 Chronic systolic (congestive) heart failure: Secondary | ICD-10-CM

## 2024-01-04 DIAGNOSIS — I340 Nonrheumatic mitral (valve) insufficiency: Secondary | ICD-10-CM

## 2024-01-04 DIAGNOSIS — I739 Peripheral vascular disease, unspecified: Secondary | ICD-10-CM | POA: Insufficient documentation

## 2024-01-04 DIAGNOSIS — F1010 Alcohol abuse, uncomplicated: Secondary | ICD-10-CM

## 2024-01-04 DIAGNOSIS — M7989 Other specified soft tissue disorders: Secondary | ICD-10-CM | POA: Insufficient documentation

## 2024-01-04 DIAGNOSIS — Z7984 Long term (current) use of oral hypoglycemic drugs: Secondary | ICD-10-CM | POA: Diagnosis not present

## 2024-01-04 DIAGNOSIS — I110 Hypertensive heart disease with heart failure: Secondary | ICD-10-CM | POA: Diagnosis not present

## 2024-01-04 DIAGNOSIS — I428 Other cardiomyopathies: Secondary | ICD-10-CM | POA: Diagnosis not present

## 2024-01-04 DIAGNOSIS — I083 Combined rheumatic disorders of mitral, aortic and tricuspid valves: Secondary | ICD-10-CM | POA: Insufficient documentation

## 2024-01-04 DIAGNOSIS — Z720 Tobacco use: Secondary | ICD-10-CM | POA: Diagnosis not present

## 2024-01-04 DIAGNOSIS — Z79899 Other long term (current) drug therapy: Secondary | ICD-10-CM | POA: Diagnosis not present

## 2024-01-04 DIAGNOSIS — Z91148 Patient's other noncompliance with medication regimen for other reason: Secondary | ICD-10-CM | POA: Diagnosis not present

## 2024-01-04 DIAGNOSIS — F17290 Nicotine dependence, other tobacco product, uncomplicated: Secondary | ICD-10-CM | POA: Insufficient documentation

## 2024-01-04 DIAGNOSIS — I10 Essential (primary) hypertension: Secondary | ICD-10-CM | POA: Diagnosis not present

## 2024-01-04 LAB — ECHOCARDIOGRAM COMPLETE
AR max vel: 2.04 cm2
AV Area VTI: 1.88 cm2
AV Area mean vel: 1.9 cm2
AV Mean grad: 3 mmHg
AV Peak grad: 6 mmHg
Ao pk vel: 1.22 m/s
MV M vel: 5.32 m/s
MV Peak grad: 113.2 mmHg
MV VTI: 1.21 cm2
S' Lateral: 5.9 cm
Single Plane A4C EF: 26 %

## 2024-01-04 LAB — BASIC METABOLIC PANEL WITH GFR
Anion gap: 10 (ref 5–15)
BUN: 14 mg/dL (ref 6–20)
CO2: 24 mmol/L (ref 22–32)
Calcium: 9.3 mg/dL (ref 8.9–10.3)
Chloride: 105 mmol/L (ref 98–111)
Creatinine, Ser: 1.13 mg/dL (ref 0.61–1.24)
GFR, Estimated: >60 mL/min (ref >60)
Glucose, Bld: 93 mg/dL (ref 70–99)
Potassium: 4.4 mmol/L (ref 3.5–5.1)
Sodium: 139 mmol/L (ref 135–145)

## 2024-01-04 LAB — BRAIN NATRIURETIC PEPTIDE: B Natriuretic Peptide: 281.9 pg/mL — ABNORMAL HIGH (ref 0.0–100.0)

## 2024-01-04 MED ORDER — FUROSEMIDE 40 MG PO TABS: 40.0000 mg | ORAL_TABLET | Freq: Two times a day (BID) | ORAL | 3 refills | Status: AC

## 2024-01-04 MED ORDER — FUROSEMIDE 40 MG PO TABS
60.0000 mg | ORAL_TABLET | Freq: Two times a day (BID) | ORAL | 11 refills | Status: DC
Start: 2024-01-04 — End: 2024-01-04

## 2024-01-04 MED ORDER — POTASSIUM CHLORIDE CRYS ER 20 MEQ PO TBCR: 20.0000 meq | EXTENDED_RELEASE_TABLET | Freq: Every day | ORAL | 3 refills | Status: DC

## 2024-01-04 NOTE — Progress Notes (Signed)
  Echocardiogram 2D Echocardiogram has been performed.  Koleen KANDICE Popper, RDCS 01/04/2024, 10:13 AM

## 2024-01-04 NOTE — Progress Notes (Signed)
 ReDS Vest / Clip - 01/04/24 1200       ReDS Vest / Clip   Station Marker C    Ruler Value 28    ReDS Value Range High volume overload    ReDS Actual Value 48

## 2024-01-04 NOTE — Progress Notes (Signed)
 [facility] Note   PCP: Patient, No Pcp Per PCP-Cardiologist: Dorn Lesches, MD  HF: Dr. Cherrie  HPI: Martin Keller is a 58 y.o. male with a hx of HTN, tobacco use, systolic HF (diagnosed 03/2018), and severe MR (diagnosed 12/19).    He was admitted 12/19 with acute systolic HF. Echo showed EF 30-35% and severe MR. R/LHC showed normal coronaries EF 20-25% and severe 3-4+ MR. TEE EF 25% severe functional MR.   Follow up with PharmD 12/21 and he was still only taking Entresto  and carvedilol  once a day. He was given a bill box and started on Jardiance  10 mg daily.  Echo 11/22: EF 25-30%, LVIDd 6.7 cm, RV okay, severe MR  Admitted 4/25 with a/c HF. Had been out of his medications for several weeks. Echo with EF 25%, G3DD, RV mildly reduced, LA severely dilated, RA mildly dilated, Mod-severe MR. Functional MR with dilated annulus. Trivial TR. He diuresed very well with IV lasix . GDMT was able to be restarted. Transitioned to PO Lasix .   Today he returns for HF follow up. Overall feeling fine. Has leg and feet swelling. No SOB with activity. Denies palpitations, abnormal bleeding, CP, dizziness, or PND/Orthopnea. Appetite ok. Weight at home 163 pounds. Taking all medications. Smokes 2-3 cigars/day, drinks ETOH every few weeks, smokes THC.  Echo today 01/04/24, EF 20-25%, reviewed with Dr. Cherrie   Cardiac Studies: - Echo today 01/04/24 EF 25%, personally reviewed with Dr. Cherrie  - Echo 07/27/23: EF 25%, GIIIDD, RV mildly reduced, LA severely dilated, RA mildly dilated, Mod-severe MR. Functional MR with dilated annulus. Trivial TR.  - Echo 11/22: EF 25-30%, LVIDd 6.7 cm, RV okay, severe MR  - Echo 11/2018: EF ~30%   - Echo 03/04/18: EF 30-35%, diffuse HK, severe MR, LA severely dilated, RA mod dilated, RV mildly down   [facility] 03/05/18 Ao = 108/68 (84)  LV = 110/15 RA = 2 RV = 39/4 PA = 31/10 (20) PCW = 8 (no significant v-waves) Fick cardiac output/index =  5.0/2.7 SVR = 1307 PVR =  2.4 WU Ao sat = 97% PA sat = 68%, 70%  1. Normal coronaries 2. Severe NICM EF 20-25% 3. 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 Well compensated filling pressures  - TEE 03/07/18: EF 25%, diffuse HK, RV mildly decreased function, trivial TR, severe central mitral regurgitation, ERO 0.6 cm^2 by PISA.  Impression: severe functional mitral regurgitation  SH: Smokes 2 black and milds/day. Has Medicaid, no problems with transportation. Rare ETOH use. Working now.  FH: mother with murmur, died of MI at 54 years, father died of MI in 71s.   Review of systems complete and found to be negative unless listed in HPI.   Past Medical History:  Diagnosis Date   Hyperlipidemia    Hypertension    Current Outpatient Medications  Medication Sig Dispense Refill   empagliflozin  (JARDIANCE ) 10 MG TABS tablet Take 1 tablet (10 mg total) by mouth daily. 90 tablet 3   furosemide  (LASIX ) 40 MG tablet Take 1 tablet (40 mg total) by mouth daily. 90 tablet 3   rosuvastatin  (CRESTOR ) 20 MG tablet Take 1 tablet (20 mg total) by mouth daily. 90 tablet 3   sacubitril -valsartan  (ENTRESTO ) 49-51 MG Take 1 tablet by mouth 2 (two) times daily. 60 tablet 11   spironolactone  (ALDACTONE ) 25 MG tablet Take 1 tablet (25 mg total) by mouth daily. 90 tablet 3   aspirin  81 MG chewable tablet Chew 1 tablet (81 mg total) by  mouth daily. (Patient not taking: Reported on 01/04/2024) 90 tablet 3   No current facility-administered medications for this encounter.   No Known Allergies   Social History   Socioeconomic History   Marital status: Single    Spouse name: Not on file   Number of children: Not on file   Years of education: Not on file   Highest education level: Not on file  Occupational History   Not on file  Tobacco Use   Smoking status: Every Day    Types: Cigars   Smokeless tobacco: Never  Vaping Use   Vaping status: Never Used  Substance and Sexual Activity   Alcohol use: Yes   Drug use: Yes     Types: Marijuana   Sexual activity: Yes    Partners: Female    Birth control/protection: None  Other Topics Concern   Not on file  Social History Narrative   Not on file   Social Drivers of Health   Financial Resource Strain: Not on file  Food Insecurity: No Food Insecurity (07/26/2023)   Hunger Vital Sign    Worried About Running Out of Food in the Last Year: Never true    Ran Out of Food in the Last Year: Never true  Transportation Needs: No Transportation Needs (07/26/2023)   PRAPARE - Administrator, Civil Service (Medical): No    Lack of Transportation (Non-Medical): No  Physical Activity: Not on file  Stress: Not on file  Social Connections: Socially Integrated (07/26/2023)   Social Connection and Isolation Panel    Frequency of Communication with Friends and Family: More than three times a week    Frequency of Social Gatherings with Friends and Family: More than three times a week    Attends Religious Services: More than 4 times per year    Active Member of [employer] or Organizations: Patient unable to answer    Attends Banker Meetings: More than 4 times per year    Marital Status: Living with partner  Intimate Partner Violence: Not At Risk (07/26/2023)   Humiliation, Afraid, Rape, and Kick questionnaire    Fear of Current or Ex-Partner: No    Emotionally Abused: No    Physically Abused: No    Sexually Abused: No   Family History  Problem Relation Age of Onset   Heart attack Mother        Died at age of 65 from heart attack.   Heart attack Father        Died at age of 70 from heart attack.   Heart disease Brother        Heart surgery at age of 50.   Diabetes Child    BP (!) 142/82   Pulse 61   Wt 77.8 kg (171 lb 9.6 oz)   SpO2 99%   BMI 24.62 kg/m   Wt Readings from Last 3 Encounters:  01/04/24 77.8 kg (171 lb 9.6 oz)  10/01/23 76 kg (167 lb 9.6 oz)  08/31/23 75.4 kg (166 lb 3.2 oz)   PHYSICAL EXAM: General:  NAD. No resp difficulty,  walked into clinic HEENT: Normal Neck: Supple. JVP 10-12 Cor: Regular rate & rhythm. No rubs, gallops or murmurs. Lungs: Clear Abdomen: Soft, nontender, nondistended.  Extremities: No cyanosis, clubbing, rash, edema Neuro: Alert & oriented x 3, moves all 4 extremities w/o difficulty. Affect pleasant.  ReDs reading: 48 %, abnormal  ASSESSMENT & PLAN:  1. Chronic systolic HF due to NICM (new as of  03/2018).  - LHC normal coronaries 12/3. Etiology unclear - possibly HTN or ETOH.  - Echo 12/19: EF 30-35%, diffuse HK, severe MR, LA severely dilated, RA mod dilated, RV mildly down.  - TEE 12/19: EF 25%, severe functional MR - Echo 8/20 EF 25-30% moderate MR. - Echo 4/25 EF 25%, RV mildly reduced, mod-severe MR - Echo today 01/04/24, EF20-25% - NYHA II, volume up - Increase Lasix  to 40 mg bid, add 20 KCL daily. 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 May need to consider future advanced therapies. Tobacco use would prohibit transplant but may consider workup for LVAD. Needs to continue to show good compliance w/ routine f/us  and w/ meds.  - Labs today, repeat BMET in 10 days.   2. Severe MR - TEE 03/07/18: EF 25%, diffuse HK, RV mildly decreased function, trivial TR, severe central mitral regurgitation, ERO 0.6 cm^2 by PISA.  Impression: severe functional mitral regurgitation.  - Moderate by echo in 8/20. - Mod-severe MR on echo 4/25. Functional MR with dilated annulus.  - Continue HF optimization per above   3. HTN - BP mildly up today - Diurese as above - Consider increasing Entresto  next if BP elevated   4. Tobacco use - Smokes THC and cigars - Discussed cessation  5. ETOH use - Has cut back - Discussed cessation  6. PAD - s/p jetstream atherectomy/DCB left SFA in 2020 - Has not seen [facility] since 2021 - Referred back  to [facility] (Dr. Sheree)  - Continue aspirin  and statin  Follow up in 2-3 weeks with APP  Harlene CHRISTELLA Gainer, FNP 01/04/24

## 2024-01-04 NOTE — Patient Instructions (Addendum)
 Increase Lasix  to 40 mg twice daily - updated Rx sent. Start potassium 20 meq daily - Rx sent. Labs today - will call you if abnormal. Repeat labs in 10 days - see below. Return to Heart Failure APP Clinic in 3 weeks - see below. Please call us  at [phone] if any questions or concerns prior to your next visit.

## 2024-01-15 ENCOUNTER — Ambulatory Visit (HOSPITAL_COMMUNITY)
Admission: RE | Admit: 2024-01-15 | Discharge: 2024-01-15 | Disposition: A | Discharge status: Home / Self Care | Source: Ambulatory Visit | Attending: Internal Medicine | Admitting: Internal Medicine

## 2024-01-15 DIAGNOSIS — I5022 Chronic systolic (congestive) heart failure: Secondary | ICD-10-CM | POA: Insufficient documentation

## 2024-01-15 LAB — BASIC METABOLIC PANEL WITH GFR
Anion gap: 10 (ref 5–15)
BUN: 12 mg/dL (ref 6–20)
CO2: 22 mmol/L (ref 22–32)
Calcium: 8.6 mg/dL — ABNORMAL LOW (ref 8.9–10.3)
Chloride: 106 mmol/L (ref 98–111)
Creatinine, Ser: 1.22 mg/dL (ref 0.61–1.24)
GFR, Estimated: >60 mL/min (ref >60)
Glucose, Bld: 81 mg/dL (ref 70–99)
Potassium: 3.8 mmol/L (ref 3.5–5.1)
Sodium: 138 mmol/L (ref 135–145)

## 2024-01-25 ENCOUNTER — Telehealth (HOSPITAL_COMMUNITY): Payer: Self-pay

## 2024-01-25 NOTE — Telephone Encounter (Signed)
 Called to confirm/remind patient of their appointment at the [facility] on 01/28/24.   Appointment:   [] Confirmed  [] Left mess   [] No answer/No voice mail  [x] VM Full/unable to leave message  [] Phone not in service

## 2024-01-25 NOTE — Progress Notes (Signed)
 Advanced [facility] Failure Clinic Note   PCP: Patient, No Pcp Per Primary Cardiologist: Dorn Lesches, MD  HF: Dr. Cherrie  HPI: Martin Keller is a 58 y.o. male with a hx of HTN, tobacco use, systolic HF (diagnosed 03/2018), and severe MR (diagnosed 12/19).    He was admitted 12/19 with acute systolic HF. Echo showed EF 30-35% and severe MR. R/LHC showed normal coronaries EF 20-25% and severe 3-4+ MR. TEE EF 25% severe functional MR.   Follow up with PharmD 12/21 and he was still only taking Entresto  and carvedilol  once a day. He was given a bill box and started on Jardiance  10 mg daily.  Echo 11/22: EF 25-30%, LVIDd 6.7 cm, RV okay, severe MR  Admitted 4/25 with a/c HF. Had been out of his medications for several weeks. Echo with EF 25%, G3DD, RV mildly reduced, LA severely dilated, RA mildly dilated, Mod-severe MR. Functional MR with dilated annulus. Trivial TR. He diuresed very well with IV lasix . GDMT was able to be restarted. Transitioned to PO Lasix .   Echo 01/04/24, EF 20-25%.  Today he returns for HF follow up. Overall feeling fine. Has some swelling in foot. Denies increasing SOB, palpitations, abnormal bleeding, CP, dizziness,or PND/Orthopnea. Appetite ok. Weight at home 169 pounds. Taking all medications. Smokes 2-3 cigars/day, drinks ETOH every few weeks, smokes THC. Works 5-6 days/week.   Cardiac Studies: - Echo 01/04/24 EF 25%  - Echo 07/27/23: EF 25%, GIIIDD, RV mildly reduced, LA severely dilated, RA mildly dilated, Mod-severe MR. Functional MR with dilated annulus. Trivial TR.  - Echo 11/22: EF 25-30%, LVIDd 6.7 cm, RV okay, severe MR  - Echo 11/2018: EF ~30%   - Echo 03/04/18: EF 30-35%, diffuse HK, severe MR, LA severely dilated, RA mod dilated, RV mildly down   [facility] 03/05/18 Ao = 108/68 (84)  LV = 110/15 RA = 2 RV = 39/4 PA = 31/10 (20) PCW = 8 (no significant v-waves) Fick cardiac output/index = 5.0/2.7 SVR = 1307 PVR =  2.4 WU Ao sat = 97% PA sat =  68%, 70%  1. Normal coronaries 2. Severe NICM EF 20-25% 3. 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 Well compensated filling pressures  - TEE 03/07/18: EF 25%, diffuse HK, RV mildly decreased function, trivial TR, severe central mitral regurgitation, ERO 0.6 cm^2 by PISA.  Impression: severe functional mitral regurgitation  SH: Smokes 2 black and milds/day. Has Medicaid, no problems with transportation. Rare ETOH use. Working now.  FH: mother with murmur, died of MI at 8 years, father died of MI in 26s.   Review of systems complete and found to be negative unless listed in HPI.   Past Medical History:  Diagnosis Date   Hyperlipidemia    Hypertension    Current Outpatient Medications  Medication Sig Dispense Refill   empagliflozin  (JARDIANCE ) 10 MG TABS tablet Take 1 tablet (10 mg total) by mouth daily. 90 tablet 3   furosemide  (LASIX ) 40 MG tablet Take 1 tablet (40 mg total) by mouth 2 (two) times daily. 180 tablet 3   rosuvastatin  (CRESTOR ) 20 MG tablet Take 1 tablet (20 mg total) by mouth daily. 90 tablet 3   sacubitril -valsartan  (ENTRESTO ) 49-51 MG Take 1 tablet by mouth 2 (two) times daily. 60 tablet 11   spironolactone  (ALDACTONE ) 25 MG tablet Take 1 tablet (25 mg total) by mouth daily. 90 tablet 3   aspirin  81 MG chewable tablet Chew 1 tablet (81 mg total) by mouth daily. (Patient not taking: Reported on  01/28/2024) 90 tablet 3   potassium chloride  SA (KLOR-CON  M) 20 MEQ tablet Take 1 tablet (20 mEq total) by mouth daily. (Patient not taking: Reported on 01/28/2024) 90 tablet 3   No current facility-administered medications for this encounter.   No Known Allergies   Social History   Socioeconomic History   Marital status: Single    Spouse name: Not on file   Number of children: Not on file   Years of education: Not on file   Highest education level: Not on file  Occupational History   Not on file  Tobacco Use   Smoking status: Every Day    Types: Cigars   Smokeless tobacco: Never   Vaping Use   Vaping status: Never Used  Substance and Sexual Activity   Alcohol use: Yes   Drug use: Yes    Types: Marijuana   Sexual activity: Yes    Partners: Female    Birth control/protection: None  Other Topics Concern   Not on file  Social History Narrative   Not on file   Social Drivers of Health   Financial Resource Strain: Not on file  Food Insecurity: No Food Insecurity (07/26/2023)   Hunger Vital Sign    Worried About Running Out of Food in the Last Year: Never true    Ran Out of Food in the Last Year: Never true  Transportation Needs: No Transportation Needs (07/26/2023)   PRAPARE - Administrator, Civil Service (Medical): No    Lack of Transportation (Non-Medical): No  Physical Activity: Not on file  Stress: Not on file  Social Connections: Socially Integrated (07/26/2023)   Social Connection and Isolation Panel    Frequency of Communication with Friends and Family: More than three times a week    Frequency of Social Gatherings with Friends and Family: More than three times a week    Attends Religious Services: More than 4 times per year    Active Member of [employer] or Organizations: Patient unable to answer    Attends Banker Meetings: More than 4 times per year    Marital Status: Living with partner  Intimate Partner Violence: Not At Risk (07/26/2023)   Humiliation, Afraid, Rape, and Kick questionnaire    Fear of Current or Ex-Partner: No    Emotionally Abused: No    Physically Abused: No    Sexually Abused: No   Family History  Problem Relation Age of Onset   Heart attack Mother        Died at age of 43 from heart attack.   Heart attack Father        Died at age of 61 from heart attack.   Heart disease Brother        Heart surgery at age of 61.   Diabetes Child    BP 118/70   Pulse 66   Wt 76.9 kg (169 lb 9.6 oz)   SpO2 97%   BMI 24.34 kg/m   Wt Readings from Last 3 Encounters:  01/28/24 76.9 kg (169 lb 9.6 oz)  01/04/24  77.8 kg (171 lb 9.6 oz)  10/01/23 76 kg (167 lb 9.6 oz)   PHYSICAL EXAM: General:  NAD. No resp difficulty, walked into clinic HEENT: Normal Neck: Supple. No JVD. Cor: Regular rate & rhythm. No rubs, gallops, 2/6 MR Lungs: Clear Abdomen: Soft, nontender, nondistended.  Extremities: No cyanosis, clubbing, rash, edema Neuro: Alert & oriented x 3, moves all 4 extremities w/o difficulty. Affect pleasant.  ReDs reading: 37%, abnormal  ASSESSMENT & PLAN:  1. Chronic systolic HF due to NICM (new as of 03/2018).  - LHC normal coronaries 12/3. Etiology unclear - possibly HTN or ETOH.  - Echo 12/19: EF 30-35%, diffuse HK, severe MR, LA severely dilated, RA mod dilated, RV mildly down.  - TEE 12/19: EF 25%, severe functional MR - Echo 8/20 EF 25-30% moderate MR. - Echo 4/25 EF 25%, RV mildly reduced, mod-severe MR - Echo 01/04/24, EF 20-25% per Dr Bensimhon's read - NYHA II, volume OK today, ReDs 37% - Continue Lasix  40 mg bid, restart 20 KCL daily. - Continue Entresto  49/51 mg bid - Continue spiro 25 mg daily - Continue Jardiance  10 mg daily - Will hold off on restarting beta blocker - Worry that he is end-stage. May need to consider future advanced therapies. Tobacco use would prohibit transplant but may consider workup for LVAD. Needs to continue to show good compliance w/ routine f/us  and w/ meds.  - Labs today. - Refer to Cardiac Rehab. Consider CPX.   2. Severe MR - TEE 03/07/18: EF 25%, diffuse HK, RV mildly decreased function, trivial TR, severe central mitral regurgitation, ERO 0.6 cm^2 by PISA.  Impression: severe functional mitral regurgitation.  - Moderate by echo in 8/20. - Mod-severe MR on echo 4/25. Functional MR with dilated annulus.  - Continue HF optimization per above   3. HTN - BP well controlled - Meds as above   4. Tobacco use - Smokes THC and cigars - Discussed cessation  5. ETOH use - Has cut back - Discussed cessation  6. PAD - s/p jetstream  atherectomy/DCB left SFA in 2020 - Has not seen [facility] since 2021 - Referred back to [facility] (Dr. Sheree)  - Restart ASA 81 daily. - Continue statin  Follow up in 3 months with Dr. Bensimhon  Georgena Weisheit M Sully Dyment, FNP 01/28/24

## 2024-01-28 ENCOUNTER — Ambulatory Visit (HOSPITAL_COMMUNITY)
Admission: RE | Admit: 2024-01-28 | Discharge: 2024-01-28 | Disposition: A | Discharge status: Home / Self Care | Source: Ambulatory Visit | Attending: Family Medicine | Admitting: Family Medicine

## 2024-01-28 ENCOUNTER — Ambulatory Visit (HOSPITAL_COMMUNITY): Payer: Self-pay | Admitting: Family Medicine

## 2024-01-28 ENCOUNTER — Encounter (HOSPITAL_COMMUNITY): Payer: Self-pay

## 2024-01-28 VITALS — BP 118/70 | HR 66 | Wt 169.6 lb

## 2024-01-28 DIAGNOSIS — I340 Nonrheumatic mitral (valve) insufficiency: Secondary | ICD-10-CM | POA: Insufficient documentation

## 2024-01-28 DIAGNOSIS — M7989 Other specified soft tissue disorders: Secondary | ICD-10-CM | POA: Diagnosis not present

## 2024-01-28 DIAGNOSIS — I739 Peripheral vascular disease, unspecified: Secondary | ICD-10-CM

## 2024-01-28 DIAGNOSIS — I5022 Chronic systolic (congestive) heart failure: Secondary | ICD-10-CM | POA: Insufficient documentation

## 2024-01-28 DIAGNOSIS — I10 Essential (primary) hypertension: Secondary | ICD-10-CM

## 2024-01-28 DIAGNOSIS — F17290 Nicotine dependence, other tobacco product, uncomplicated: Secondary | ICD-10-CM | POA: Diagnosis not present

## 2024-01-28 DIAGNOSIS — Z7984 Long term (current) use of oral hypoglycemic drugs: Secondary | ICD-10-CM | POA: Diagnosis not present

## 2024-01-28 DIAGNOSIS — Z79899 Other long term (current) drug therapy: Secondary | ICD-10-CM | POA: Insufficient documentation

## 2024-01-28 DIAGNOSIS — I110 Hypertensive heart disease with heart failure: Secondary | ICD-10-CM | POA: Diagnosis not present

## 2024-01-28 DIAGNOSIS — Z720 Tobacco use: Secondary | ICD-10-CM | POA: Diagnosis not present

## 2024-01-28 DIAGNOSIS — Z7982 Long term (current) use of aspirin: Secondary | ICD-10-CM | POA: Insufficient documentation

## 2024-01-28 DIAGNOSIS — F1010 Alcohol abuse, uncomplicated: Secondary | ICD-10-CM

## 2024-01-28 LAB — BASIC METABOLIC PANEL WITH GFR
Anion gap: 13 (ref 5–15)
BUN: 13 mg/dL (ref 6–20)
CO2: 22 mmol/L (ref 22–32)
Calcium: 9.4 mg/dL (ref 8.9–10.3)
Chloride: 106 mmol/L (ref 98–111)
Creatinine, Ser: 1.38 mg/dL — ABNORMAL HIGH (ref 0.61–1.24)
GFR, Estimated: 59 mL/min — ABNORMAL LOW (ref >60)
Glucose, Bld: 84 mg/dL (ref 70–99)
Potassium: 4 mmol/L (ref 3.5–5.1)
Sodium: 141 mmol/L (ref 135–145)

## 2024-01-28 LAB — BRAIN NATRIURETIC PEPTIDE: B Natriuretic Peptide: 184.1 pg/mL — ABNORMAL HIGH (ref 0.0–100.0)

## 2024-01-28 MED ORDER — POTASSIUM CHLORIDE CRYS ER 20 MEQ PO TBCR
20.0000 meq | EXTENDED_RELEASE_TABLET | Freq: Every day | ORAL | 3 refills | Status: AC
Start: 2024-01-28 — End: ?

## 2024-01-28 MED ORDER — ASPIRIN 81 MG PO CHEW: 81.0000 mg | CHEWABLE_TABLET | Freq: Every day | ORAL | 3 refills | Status: AC

## 2024-01-28 NOTE — Addendum Note (Signed)
 Encounter addended by: Cathern Andriette DEL, LCSW on: 01/28/2024 12:37 PM  Actions taken: Clinical Note Signed

## 2024-01-28 NOTE — Progress Notes (Signed)
 ReDS Vest / Clip - 01/28/24 1100       ReDS Vest / Clip   Station Marker C    Ruler Value 31.5    ReDS Value Range Moderate volume overload    ReDS Actual Value 37

## 2024-01-28 NOTE — Progress Notes (Signed)
 H&V Care Navigation CSW Progress Note  Clinical Social Worker consulted to speak with pt regarding housing.  Pt stays with his child's mother but states it is subsidized housing and he isn't on the lease so worried about this eventually getting them in trouble and having her lose housing.  He has been then about 1.5 years now.  States he works full time and can afford $600-700/month in rent.  CSW provided with list of potential options in pricerange and encouraged him to reach out if there are concerns with paying for start up cost for housing so we could assist as able.  Will continue to follow and assist as needed  Martin HILARIO Leech, LCSW Clinical Social Worker [facility] Desk#: [phone] Cell#: [phone]

## 2024-01-28 NOTE — Patient Instructions (Addendum)
 Thank you for coming in today  If you had labs drawn today, any labs that are abnormal the clinic will call you No news is good news  You have been referred to cardiac rehab they will contact you for further details   Medications: RESTART Aspirin  81 mg 1 tablet daily RESTART Potassium 20 meq 1 tablet daily    Follow up appointments:  Your physician recommends that you schedule a follow-up appointment in:  3 months With Dr. Bensimhon    Do the following things EVERYDAY: Weigh yourself in the morning before breakfast. Write it down and keep it in a log. Take your medicines as prescribed Eat low salt foods--Limit salt (sodium) to 2000 mg per day.  Stay as active as you can everyday Limit all fluids for the day to less than 2 liters   At the [facility], you and your health needs are our priority. As part of our continuing mission to provide you with exceptional heart care, we have created designated Provider Care Teams. These Care Teams include your primary Cardiologist (physician) and Advanced Practice Providers (APPs- Physician Assistants and Nurse Practitioners) who all work together to provide you with the care you need, when you need it.   You may see any of the following providers on your designated Care Team at your next follow up: Dr Toribio Fuel Dr Ezra Shuck Dr. Ria Gardenia Greig Lenetta, NP Caffie Shed, [place] [facility] [place], [place] Beckey Coe, NP Tinnie Redman, PharmD   Please be sure to bring in all your medications bottles to every appointment.    Thank you for choosing [facility]  If you have any questions or concerns before your next appointment please send us  a message through [place] or call our office at [phone].    TO LEAVE A MESSAGE FOR THE NURSE SELECT OPTION 2, PLEASE LEAVE A MESSAGE INCLUDING: YOUR NAME DATE OF BIRTH CALL BACK NUMBER REASON FOR CALL**this is  important as we prioritize the call backs  YOU WILL RECEIVE A CALL BACK THE SAME DAY AS LONG AS YOU CALL BEFORE 4:00 PM

## 2024-01-31 ENCOUNTER — Telehealth (HOSPITAL_COMMUNITY): Payer: Self-pay

## 2024-01-31 ENCOUNTER — Encounter (HOSPITAL_COMMUNITY): Payer: Self-pay

## 2024-01-31 NOTE — Telephone Encounter (Signed)
 Attempted to call patient in regards to Cardiac Rehab - LM on VM Mailed letter

## 2024-04-07 ENCOUNTER — Encounter (HOSPITAL_BASED_OUTPATIENT_CLINIC_OR_DEPARTMENT_OTHER): Payer: Self-pay

## 2024-04-07 ENCOUNTER — Emergency Department (HOSPITAL_BASED_OUTPATIENT_CLINIC_OR_DEPARTMENT_OTHER): Admission: EM | Admit: 2024-04-07 | Discharge: 2024-04-07 | Disposition: A | Discharge status: Home / Self Care

## 2024-04-07 ENCOUNTER — Emergency Department (HOSPITAL_BASED_OUTPATIENT_CLINIC_OR_DEPARTMENT_OTHER)

## 2024-04-07 ENCOUNTER — Other Ambulatory Visit: Payer: Self-pay

## 2024-04-07 DIAGNOSIS — R0602 Shortness of breath: Secondary | ICD-10-CM | POA: Insufficient documentation

## 2024-04-07 DIAGNOSIS — Z79899 Other long term (current) drug therapy: Secondary | ICD-10-CM | POA: Insufficient documentation

## 2024-04-07 DIAGNOSIS — I509 Heart failure, unspecified: Secondary | ICD-10-CM | POA: Diagnosis not present

## 2024-04-07 DIAGNOSIS — I110 Hypertensive heart disease with heart failure: Secondary | ICD-10-CM | POA: Insufficient documentation

## 2024-04-07 DIAGNOSIS — Z7982 Long term (current) use of aspirin: Secondary | ICD-10-CM | POA: Diagnosis not present

## 2024-04-07 LAB — CBC
HCT: 43.3 % (ref 39.0–52.0)
Hemoglobin: 14.5 g/dL (ref 13.0–17.0)
MCH: 27.9 pg (ref 26.0–34.0)
MCHC: 33.5 g/dL (ref 30.0–36.0)
MCV: 83.4 fL (ref 80.0–100.0)
Platelets: 179 K/uL (ref 150–400)
RBC: 5.19 MIL/uL (ref 4.22–5.81)
RDW: 14.7 % (ref 11.5–15.5)
WBC: 7.9 K/uL (ref 4.0–10.5)
nRBC: 0 % (ref 0.0–0.2)

## 2024-04-07 LAB — BASIC METABOLIC PANEL WITH GFR
Anion gap: 15 (ref 5–15)
BUN: 15 mg/dL (ref 6–20)
CO2: 26 mmol/L (ref 22–32)
Calcium: 9.8 mg/dL (ref 8.9–10.3)
Chloride: 97 mmol/L — ABNORMAL LOW (ref 98–111)
Creatinine, Ser: 1.31 mg/dL — ABNORMAL HIGH (ref 0.61–1.24)
GFR, Estimated: >60 mL/min
Glucose, Bld: 100 mg/dL — ABNORMAL HIGH (ref 70–99)
Potassium: 4.1 mmol/L (ref 3.5–5.1)
Sodium: 138 mmol/L (ref 135–145)

## 2024-04-07 LAB — RESP PANEL BY RT-PCR (RSV, FLU A&B, COVID)  RVPGX2
Influenza A by PCR: NEGATIVE
Influenza B by PCR: NEGATIVE
Resp Syncytial Virus by PCR: NEGATIVE
SARS Coronavirus 2 by RT PCR: NEGATIVE

## 2024-04-07 LAB — PRO BRAIN NATRIURETIC PEPTIDE: Pro Brain Natriuretic Peptide: 4576 pg/mL — ABNORMAL HIGH

## 2024-04-07 MED ORDER — FUROSEMIDE 10 MG/ML IJ SOLN
40.0000 mg | Freq: Once | INTRAMUSCULAR | Status: AC
  Administered 2024-04-07: 40 mg via INTRAVENOUS
  Filled 2024-04-07: qty 4

## 2024-04-07 NOTE — ED Provider Notes (Signed)
 " [place] [facility] AT [facility] Provider Note   CSN: [id number] Arrival date & time: 04/07/24  9071     Patient presents with: Cough   Martin Keller is a 59 y.o. male.   This is a 59 year old male presenting emergency department with generalized malaise, body aches, headaches, and cough.  Symptoms x 2 days.  Family members with similar symptoms.  History of CHF. Endorsing shortness of breath and dyspnea on exertion as well.  History of heart failure.  Has been out of his diuretics and another medication that he cannot recall name off.  He has taken some of his significant others medications that are supposedly the same dose.  Denying chest pain, abdominal pain nausea or vomiting.   Cough      Prior to Admission medications  Medication Sig Start Date End Date Taking? Authorizing Provider  aspirin  81 MG chewable tablet Chew 1 tablet (81 mg total) by mouth daily. 01/28/24   Milford, Harlene HERO, FNP  empagliflozin  (JARDIANCE ) 10 MG TABS tablet Take 1 tablet (10 mg total) by mouth daily. 10/01/23   Bensimhon, Toribio SAUNDERS, MD  furosemide  (LASIX ) 40 MG tablet Take 1 tablet (40 mg total) by mouth 2 (two) times daily. 01/04/24   Milford, Harlene HERO, FNP  potassium chloride  SA (KLOR-CON  M) 20 MEQ tablet Take 1 tablet (20 mEq total) by mouth daily. 01/28/24   Milford, Harlene HERO, FNP  rosuvastatin  (CRESTOR ) 20 MG tablet Take 1 tablet (20 mg total) by mouth daily. 10/01/23   Bensimhon, Toribio SAUNDERS, MD  sacubitril -valsartan  (ENTRESTO ) 49-51 MG Take 1 tablet by mouth 2 (two) times daily. 09/27/23   Marcine Catalan M, PA-C  spironolactone  (ALDACTONE ) 25 MG tablet Take 1 tablet (25 mg total) by mouth daily. 10/01/23   Bensimhon, Toribio SAUNDERS, MD    Allergies: Patient has no known allergies.    Review of Systems  Respiratory:  Positive for cough.     Updated Vital Signs BP (!) 141/94 (BP Location: Left Arm)   Pulse 85   Temp 98.1 F (36.7 C) (Oral)   Resp (!) 23   Ht 5' 10  (1.778 m)   Wt 76.9 kg   SpO2 95%   BMI 24.33 kg/m   Physical Exam Vitals and nursing note reviewed.  Constitutional:      General: He is not in acute distress.    Appearance: He is not toxic-appearing.  HENT:     Head: Normocephalic.     Nose: Nose normal.     Mouth/Throat:     Mouth: Mucous membranes are moist.  Eyes:     Conjunctiva/sclera: Conjunctivae normal.  Cardiovascular:     Rate and Rhythm: Normal rate and regular rhythm.  Pulmonary:     Effort: Pulmonary effort is normal. No respiratory distress.     Breath sounds: No rhonchi or rales.  Abdominal:     General: Abdomen is flat. There is no distension.     Tenderness: There is no abdominal tenderness. There is no guarding or rebound.  Musculoskeletal:        General: Normal range of motion.  Skin:    General: Skin is warm and dry.     Capillary Refill: Capillary refill takes less than 2 seconds.  Neurological:     Mental Status: He is alert and oriented to person, place, and time.  Psychiatric:        Mood and Affect: Mood normal.        Behavior: Behavior  normal.     (all labs ordered are listed, but only abnormal results are displayed) Labs Reviewed  BASIC METABOLIC PANEL WITH GFR - Abnormal; Notable for the following components:      Result Value   Chloride 97 (*)    Glucose, Bld 100 (*)    Creatinine, Ser 1.31 (*)    All other components within normal limits  PRO BRAIN NATRIURETIC PEPTIDE - Abnormal; Notable for the following components:   Pro Brain Natriuretic Peptide 4,576.0 (*)    All other components within normal limits  RESP PANEL BY RT-PCR (RSV, FLU A&B, COVID)  RVPGX2  CBC    EKG: EKG Interpretation Date/Time:  Monday April 07 2024 09:54:33 EST Ventricular Rate:  88 PR Interval:  168 QRS Duration:  98 QT Interval:  375 QTC Calculation: 454 R Axis:   41  Text Interpretation: Sinus rhythm Multiple ventricular premature complexes Biatrial enlargement Left ventricular hypertrophy  Confirmed by Neysa Clap [phone]) on 04/07/2024 10:17:21 AM  Radiology: [facility] Chest 2 View Result Date: 04/07/2024 CLINICAL DATA:  Cough, body aches, and chills EXAM: CHEST - 2 VIEW COMPARISON:  Chest radiograph dated 07/25/2023 FINDINGS: Hyperinflated lungs. Bilateral perihilar interstitial opacities. No pleural effusion or pneumothorax. Unchanged enlarged cardiomediastinal silhouette. No acute osseous abnormality. IMPRESSION: Bilateral perihilar interstitial opacities, which may represent pulmonary edema or atypical infection. Electronically Signed   By: Limin  Xu M.D.   On: 04/07/2024 11:27     Procedures   Medications Ordered in the ED  furosemide  (LASIX ) injection 40 mg (40 mg Intravenous Given 04/07/24 1227)    Clinical Course as of 04/07/24 1237  Mon Apr 07, 2024  0948 Echo in 01/04/24: 1. Left ventricular ejection fraction, by estimation, is 30 to 35%. The  left ventricle has moderately decreased function. The left ventricle  demonstrates global hypokinesis. The left ventricular internal cavity size  was severely dilated. Left  ventricular diastolic parameters are consistent with Grade I diastolic  dysfunction (impaired relaxation).   2. Right ventricular systolic function is normal. The right ventricular  size is normal. Mildly increased right ventricular wall thickness. There  is normal pulmonary artery systolic pressure. The estimated right  ventricular systolic pressure is 21.7  mmHg.   3. Left atrial size was mildly dilated.   4. The mitral valve is degenerative. Severe mitral valve regurgitation.  No evidence of mitral stenosis.   5. The aortic valve is grossly normal. Unable to determine aortic valve  morphology due to image quality. Aortic valve regurgitation is not  visualized. Aortic valve sclerosis is present, with no evidence of aortic  valve stenosis.   6. The inferior vena cava is dilated in size with >50% respiratory  variability, suggesting right atrial pressure of  8 mmHg.   7. Evidence of atrial level shunting detected by color flow Doppler.   [TY]  1046 Pro Brain natriuretic peptide(!) Elevated BNP, but similarly elevated 8 months ago.  Clinically euvolemic [TY]  1050 Basic metabolic panel(!) Stable.  No electrolyte abnormalities.  Appears to have baseline renal function compared to prior labs. [TY]    Clinical Course User Index [TY] Neysa Clap PARAS, DO                                 Medical Decision Making This is a 58 year old male complex past medical history to include CHF with reduced EF hypertension, hyperlipidemia presenting emergency department with mixed gold picture, viral URI  with possible superimposed component of CHF.  He is afebrile nontachycardic maintaining oxygen saturation on room air.  Does not appear to be in overt distress.  Does have some coarse breath sounds on exam, but no overt crackles or rails.  Possible viral etiology given family members with similar symptoms, however he is negative for flu and COVID therefore no indication for antivirals.  He has no leukocytosis to suggest an overt infectious process, no anemia.  His basic metabolic panel with baseline creatinine.  BNP is elevated, but appears to be similarly elevated in the past, chest x-ray with edema versus atypical infection.  Clinically euvolemic on exam. Given dose of Lasix  here.  Ambulatory in the department without desaturation.  Offered admission for further diuresis and monitoring given his co morbidities.  However patient declined after lengthy discussion about the benefits and risks.  He does note that his Lasix  and cardiac meds are available at the pharmacy today and he is going to pick them up.  Encouraged him to take a double dose for the next 2 days and follow-up with his cardiologist to soon as possible.  Will discharge at patient's request.  Amount and/or Complexity of Data Reviewed External Data Reviewed:     Details: See ED course Labs: ordered.  Decision-making details documented in ED Course. Radiology: ordered.    Details: Appears to be more vascular congestion and infiltrate on my independent review. ECG/medicine tests: ordered and independent interpretation performed.    Details: No ischemic changes  Risk Prescription drug management. Decision regarding hospitalization. Diagnosis or treatment significantly limited by social determinants of health.      Final diagnoses:  None    ED Discharge Orders     None          Neysa Caron PARAS, DO 04/07/24 1237  "

## 2024-04-07 NOTE — ED Triage Notes (Signed)
 C/o cough, sore throat, headache, bodyaches, chills since Friday.

## 2024-04-07 NOTE — ED Notes (Signed)
 ED Provider at bedside.

## 2024-04-07 NOTE — Discharge Instructions (Signed)
 Please take a double dose of your Lasix  for the next 48 hours and follow-up with your cardiologist and primary doctor as soon as possible.  Return for fevers, chills, lightheadedness, passout, palpitations, chest pain, difficulty breathing, worsening shortness of breath or any new or worsening symptoms that are concerning to you.
